# Patient Record
Sex: Female | Born: 1960 | Race: White | Hispanic: No | Marital: Married | Smoking: Never smoker
Health system: Southern US, Community
[De-identification: ages and names within clinical notes are randomized; demographics above are authoritative.]

## PROBLEM LIST (undated history)

## (undated) DIAGNOSIS — I10 Essential (primary) hypertension: Secondary | ICD-10-CM

## (undated) DIAGNOSIS — R55 Syncope and collapse: Secondary | ICD-10-CM

## (undated) DIAGNOSIS — G4733 Obstructive sleep apnea (adult) (pediatric): Secondary | ICD-10-CM

## (undated) DIAGNOSIS — K649 Unspecified hemorrhoids: Secondary | ICD-10-CM

## (undated) DIAGNOSIS — E785 Hyperlipidemia, unspecified: Secondary | ICD-10-CM

## (undated) DIAGNOSIS — R519 Headache, unspecified: Secondary | ICD-10-CM

## (undated) DIAGNOSIS — C439 Malignant melanoma of skin, unspecified: Secondary | ICD-10-CM

## (undated) DIAGNOSIS — K635 Polyp of colon: Secondary | ICD-10-CM

## (undated) DIAGNOSIS — K228 Other specified diseases of esophagus: Secondary | ICD-10-CM

## (undated) DIAGNOSIS — K2289 Other specified disease of esophagus: Secondary | ICD-10-CM

## (undated) DIAGNOSIS — G8929 Other chronic pain: Secondary | ICD-10-CM

## (undated) DIAGNOSIS — R51 Headache: Secondary | ICD-10-CM

## (undated) HISTORY — DX: Obstructive sleep apnea (adult) (pediatric): G47.33

## (undated) HISTORY — DX: Headache, unspecified: R51.9

## (undated) HISTORY — DX: Hyperlipidemia, unspecified: E78.5

## (undated) HISTORY — PX: VESICOVAGINAL FISTULA CLOSURE W/ TAH: SUR271

## (undated) HISTORY — DX: Polyp of colon: K63.5

## (undated) HISTORY — DX: Other specified diseases of esophagus: K22.8

## (undated) HISTORY — PX: BLADDER SUSPENSION: SHX72

## (undated) HISTORY — DX: Malignant melanoma of skin, unspecified: C43.9

## (undated) HISTORY — DX: Unspecified hemorrhoids: K64.9

## (undated) HISTORY — DX: Other specified disease of esophagus: K22.89

## (undated) HISTORY — PX: ABDOMINAL HYSTERECTOMY: SHX81

## (undated) HISTORY — PX: TUBAL LIGATION: SHX77

## (undated) HISTORY — DX: Other chronic pain: G89.29

## (undated) HISTORY — PX: KNEE SURGERY: SHX244

## (undated) HISTORY — DX: Syncope and collapse: R55

## (undated) HISTORY — PX: PACEMAKER INSERTION: SHX728

## (undated) HISTORY — DX: Headache: R51

## (undated) HISTORY — PX: COLONOSCOPY: SHX174

## (undated) HISTORY — PX: ESOPHAGEAL DILATION: SHX303

## (undated) HISTORY — DX: Essential (primary) hypertension: I10

## (undated) HISTORY — PX: LUMBAR DISC SURGERY: SHX700

---

## 2002-08-01 ENCOUNTER — Emergency Department (HOSPITAL_COMMUNITY): Admission: EM | Admit: 2002-08-01 | Discharge: 2002-08-01 | Payer: Self-pay | Admitting: Anesthesiology

## 2008-02-01 ENCOUNTER — Ambulatory Visit: Payer: Self-pay | Admitting: Internal Medicine

## 2008-02-08 ENCOUNTER — Ambulatory Visit: Payer: Self-pay | Admitting: Internal Medicine

## 2008-02-08 LAB — CONVERTED CEMR LAB
BUN: 11 mg/dL (ref 6–23)
Basophils Absolute: 0.1 10*3/uL (ref 0.0–0.1)
Calcium: 9.1 mg/dL (ref 8.4–10.5)
Creatinine, Ser: 0.7 mg/dL (ref 0.4–1.2)
Eosinophils Absolute: 0.3 10*3/uL (ref 0.0–0.7)
GFR calc Af Amer: 114 mL/min
GFR calc non Af Amer: 95 mL/min
HCT: 39 % (ref 36.0–46.0)
Lymphocytes Relative: 30.3 % (ref 12.0–46.0)
Neutrophils Relative %: 53.6 % (ref 43.0–77.0)
Platelets: 201 10*3/uL (ref 150–400)
Prothrombin Time: 12 s (ref 10.9–13.3)
RBC: 4.59 M/uL (ref 3.87–5.11)
RDW: 13 % (ref 11.5–14.6)
Sodium: 140 meq/L (ref 135–145)
aPTT: 27.8 s (ref 21.7–29.8)

## 2008-02-15 ENCOUNTER — Ambulatory Visit (HOSPITAL_COMMUNITY): Admission: RE | Admit: 2008-02-15 | Discharge: 2008-02-16 | Payer: Self-pay | Admitting: Internal Medicine

## 2008-02-15 ENCOUNTER — Ambulatory Visit: Payer: Self-pay | Admitting: Internal Medicine

## 2008-03-07 ENCOUNTER — Ambulatory Visit: Payer: Self-pay

## 2008-05-16 ENCOUNTER — Ambulatory Visit: Payer: Self-pay | Admitting: Internal Medicine

## 2008-05-17 ENCOUNTER — Encounter: Payer: Self-pay | Admitting: Internal Medicine

## 2008-05-22 ENCOUNTER — Emergency Department (HOSPITAL_COMMUNITY): Admission: EM | Admit: 2008-05-22 | Discharge: 2008-05-22 | Payer: Self-pay | Admitting: Emergency Medicine

## 2009-02-19 ENCOUNTER — Ambulatory Visit: Payer: Self-pay | Admitting: Internal Medicine

## 2009-02-19 DIAGNOSIS — I10 Essential (primary) hypertension: Secondary | ICD-10-CM | POA: Insufficient documentation

## 2009-02-19 DIAGNOSIS — G4730 Sleep apnea, unspecified: Secondary | ICD-10-CM | POA: Insufficient documentation

## 2009-05-13 ENCOUNTER — Encounter: Payer: Self-pay | Admitting: Internal Medicine

## 2009-06-07 ENCOUNTER — Encounter: Payer: Self-pay | Admitting: Internal Medicine

## 2009-06-24 ENCOUNTER — Encounter: Payer: Self-pay | Admitting: Internal Medicine

## 2009-10-31 ENCOUNTER — Encounter (INDEPENDENT_AMBULATORY_CARE_PROVIDER_SITE_OTHER): Payer: Self-pay | Admitting: *Deleted

## 2010-05-13 NOTE — Letter (Signed)
Summary: Device-Delinquent Phone Journalist, newspaper, Main Office  [address]   [place], [place] [address]   Phone: [phone]  Fax: [phone]     June 07, 2009 MRN: [id number]   STARR URIAS [address] [place], [place]  [address]   Dear Ms. Wager,  According to our records, you were scheduled for a device phone transmission on  May 20, 2009.     We did not receive any results from this check.  If you transmitted on your scheduled day, please call us to help troubleshoot your system.  If you forgot to send your transmission, please send one upon receipt of this letter.  Thank you,   Architectural technologist [facility]

## 2010-05-13 NOTE — Letter (Signed)
Summary: Device-Delinquent Phone Journalist, newspaper, Main Office  [address]   [place], [place] [address]   Phone: [phone]  Fax: [phone]     June 24, 2009 MRN: [id number]   ELLIEANNA FUNDERBURG [address] [place], [place]  [address]   Dear Ms. Ostrow,  According to our records, you were scheduled for a device phone transmission on May 20, 2009.     We did not receive any results from this check.  If you transmitted on your scheduled day, please call us to help troubleshoot your system.  If you forgot to send your transmission, please send one upon receipt of this letter.  Thank you,   Architectural technologist [facility]

## 2010-05-13 NOTE — Consult Note (Signed)
Summary: [facility]  [facility]   Imported By: Marylou Mccoy 09/11/2009 16:53:46  _____________________________________________________________________  External Attachment:    Type:   Image     Comment:   External Document

## 2010-05-13 NOTE — Letter (Signed)
Summary: Device-Delinquent Phone Journalist, newspaper, Main Office  [address]   [place], [place] [address]   Phone: [phone]  Fax: [phone]     October 31, 2009 MRN: [id number]   Marilyn Ortiz [address] [place], [place]  [address]   Dear [name],  According to our records, you were scheduled for a device phone transmission on                                 05-20-2009.  We did not receive any results from this check.  If you transmitted on your scheduled day, please call us to help troubleshoot your system.  If you forgot to send your transmission, please send one upon receipt of this letter.  Thank you,   Architectural technologist [facility]

## 2010-06-13 ENCOUNTER — Encounter (INDEPENDENT_AMBULATORY_CARE_PROVIDER_SITE_OTHER): Payer: Self-pay | Admitting: *Deleted

## 2010-06-19 NOTE — Letter (Signed)
Summary: Appointment - Reschedule  [employer], Main Office  [address]   [place], [place] [address]   Phone: [phone]  Fax: [phone]     June 13, 2010 MRN: [id number]   Marilyn Ortiz [address] [place], [place]  [address]   Dear Ms. Brendle,   Due to a change in our office schedule, your appointment on   07-08-2010                    at  11:40 a.m.              must be changed.  It is very important that we reach you to reschedule this appointment. We look forward to participating in your health care needs. Please contact us at the number listed above at your earliest convenience to reschedule this appointment.     Sincerely,     Lorne Skeens [facility] Scheduling Team

## 2010-06-24 ENCOUNTER — Encounter: Payer: Self-pay | Admitting: Internal Medicine

## 2010-07-08 ENCOUNTER — Encounter: Payer: Self-pay | Admitting: Internal Medicine

## 2010-07-22 ENCOUNTER — Encounter: Payer: Self-pay | Admitting: Internal Medicine

## 2010-07-22 ENCOUNTER — Ambulatory Visit (INDEPENDENT_AMBULATORY_CARE_PROVIDER_SITE_OTHER): Payer: BC Managed Care – PPO | Admitting: Internal Medicine

## 2010-07-22 DIAGNOSIS — G4730 Sleep apnea, unspecified: Secondary | ICD-10-CM

## 2010-07-22 DIAGNOSIS — R55 Syncope and collapse: Secondary | ICD-10-CM | POA: Insufficient documentation

## 2010-07-22 DIAGNOSIS — R5383 Other fatigue: Secondary | ICD-10-CM

## 2010-07-22 DIAGNOSIS — Z950 Presence of cardiac pacemaker: Secondary | ICD-10-CM | POA: Insufficient documentation

## 2010-07-22 DIAGNOSIS — R5381 Other malaise: Secondary | ICD-10-CM

## 2010-07-22 NOTE — Assessment & Plan Note (Signed)
Her diastolic blood pressure is elevated. Because of her sleep apnea, I'm hoping that it is secondary. Rather than change her medications at this time we will pursue reevaluation of her sleep apnea

## 2010-07-22 NOTE — Assessment & Plan Note (Signed)
The patient's device was interrogated.  The information was reviewed. No changes were made in the programming.    

## 2010-07-22 NOTE — Assessment & Plan Note (Signed)
Undoubtedly there were many causes for fatigue. In her, I wonder whether her beta blocker or her sleep apnea is continuing. Given the intention of trying to pursue evaluation of the latter, we will plan that first. In the event that her fatigue persists, I would recommend that we stop her beta blocker and try her on alternative Anti hypertensive therapy

## 2010-07-22 NOTE — Patient Instructions (Signed)
Your physician recommends that you schedule a follow-up appointment in: YEAR WITH DR Graciela Husbands AND [facility] AS SCHEDULED. Your physician recommends that you continue on your current medications as directed. Please refer to the Current Medication list given to you today. You have been referred to  DR SOOD OR DR ALVA  DX FATIGUE, HX OFSLEEP APNEA

## 2010-07-22 NOTE — Progress Notes (Signed)
  HPI  Marilyn Ortiz is a 50 y.o. 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  She also has a history of fatigue and sleep apnea. Unfortunately her sleep apnea mask is not wearing well; she has not been seen by the application team since it was initiated. She continues to have problems with hypertension. There has been some peripheral edema. She takes a diuretic.  She denies chest pain. There is some exercise intolerance manifested as dyspnea.  Past Medical History  Diagnosis Date  . Deglutition syncope   . HTN (hypertension)   . Chronic headaches   . OSA (obstructive sleep apnea)   . Dilation of esophagus     Past Surgical History  Procedure Date  . Pacemaker insertion   . Tubal ligation   . Knee surgery     Current Outpatient Prescriptions  Medication Sig Dispense Refill  . atorvastatin (LIPITOR) 20 MG tablet Take 20 mg by mouth daily.        . Doxylamine Succinate, Sleep, (UNISOM) 25 MG tablet Take 50 mg by mouth at bedtime as needed.        . hydrochlorothiazide (,MICROZIDE/HYDRODIURIL,) 12.5 MG capsule Take 12.5 mg by mouth daily.        [name] ketoprofen (ORUDIS) 75 MG capsule Take 75 mg by mouth 4 (four) times daily as needed.        . loratadine (CLARITIN) 10 MG tablet Take 10 mg by mouth daily.        . nebivolol (BYSTOLIC) 5 MG tablet Take 10 mg by mouth daily.       . potassium chloride (K-DUR) 10 MEQ tablet Take 10 mEq by mouth daily.        [name] DISCONTD: amLODipine (NORVASC) 5 MG tablet Take 5 mg by mouth daily.       [name] DISCONTD: esomeprazole (NEXIUM) 40 MG capsule Take 40 mg by mouth 2 (two) times daily.          Allergies  Allergen Reactions  . Codeine   . Morphine And Related   . Penicillins     Review of Systems negative except from HPI and PMH  Physical Exam Well developed and well nourished in no acute distress HENT normal E scleral and icterus clear Neck Supple JVP flat; carotids brisk and full Clear to  ausculation Regular rate and rhythm, no murmurs gallops or rub Soft with active bowel sounds No clubbing cyanosis and edema Alert and oriented, grossly normal motor and sensory function Skin Warm and Dry    Assessment and  Plan

## 2010-07-22 NOTE — Assessment & Plan Note (Signed)
As noted below, we will plan to have her see a sleep specialist to see if some adjustments of her therapy can be accomplished.

## 2010-07-22 NOTE — Assessment & Plan Note (Signed)
No recurrent syncope 

## 2010-07-29 LAB — BASIC METABOLIC PANEL
Chloride: 108 mEq/L (ref 96–112)
GFR calc Af Amer: >60 mL/min (ref >60)
GFR calc non Af Amer: >60 mL/min (ref >60)
Sodium: 138 mEq/L (ref 135–145)

## 2010-08-15 ENCOUNTER — Institutional Professional Consult (permissible substitution): Payer: BC Managed Care – PPO | Admitting: Pulmonary Disease

## 2010-08-26 ENCOUNTER — Encounter: Payer: Self-pay | Admitting: *Deleted

## 2010-08-26 NOTE — Discharge Summary (Signed)
NAMEANERI, Marilyn Ortiz                 ACCOUNT NO.:  [id number]   MEDICAL RECORD NO.:  [id number]          PATIENT TYPE:  INP   LOCATION:  3731                         FACILITY:  [facility]   PHYSICIAN:  Duke Salvia, MD, FACCDATE OF BIRTH:  12-15-1960   DATE OF ADMISSION:  02/15/2008  DATE OF DISCHARGE:  02/16/2008                               DISCHARGE SUMMARY   This patient has allergies to PENICILLIN and intolerance of CODEINE.   FINAL DIAGNOSES:  1. Discharging day 1 status post implant of a St. Jude Accent RF dual-      chamber pacemaker.  2. History of presyncope/syncope concurrence with eating or drinking      (deglutition syncope).  3. Migraine headache postprocedure delaying discharge postprocedure      day #1, treated with 1 g IV Depakote over 10 minutes with      resolution of symptoms, then the patient ready for discharge.   PROCEDURE:  February 15, 2008, implant of the [place]. Jude Accent dual-  chamber pacemaker for deglutition syncope by Dr. Graciela Husbands.   BRIEF HISTORY:  Marilyn Ortiz is a 50 year old female.  She has been  referred in consultation with diagnosis of deglutition syncope.   She has a 10-year history of recurrent episodes.  She describes them as  a sudden storminess in her head which is followed by presyncope or  syncope.  They have all occurred with eating and/or drinking.  They have  become increasingly frequent and now occur almost daily.   PAST MEDICAL HISTORY:  Notable for hypertension.  She has migraine  headaches.  She has been diagnosed with obstructive sleep apnea, but  does not use continuous positive airway pressure.   The patient also has a history of ulcers and esophageal dilatation.  She  has had bladder tack procedure, skin cancer removal, and seasonal  allergies.   She has clear evidence of deglutition syncope with significant sinus  node dysfunction as the mechanism underlying presyncope and also loss of  consciousness.  Pacemaker is indicated.   The risks and benefits of this  procedure have been described to the patient and she is willing to  proceed with this procedure.  St. Jude pacemaker will be utilized as it  has a very Programmer, systems life.   HOSPITAL COURSE:  The patient presents electively on February 15, 2008.  She underwent successful implantation of this dual-chamber pacemaker by  Dr. Sherryl Manges.  In the postprocedure period, she developed migraine  headache.  This was treated with 1 g of IV Depakote with successful  resolution of her symptoms.  She described this particular headache as  of the usual sort, neither severe nor light, but about moderate in its  symptoms.  The patient is willing to go home on February 16, 2008, on the  following medications.  1. Topamax 25 mg daily.  2. Claritin 10 mg daily.  3. Diovan 160 mg daily.   She is asked to keep her incision dry for the next 7 days and to sponge  bathe until Wednesday February 22, 2008.  Mobility of the  left arm has  been discussed as has incision care.  She has followup at [facility] on March 08, 2008, [place], [place].  [facility] Wednesday, March 07, 2008, at 9 o'clock.  She sees Dr. Graciela Husbands  on May 16, 2008, at 11:15.   Labs pertinent to this admission were drawn on February 08, 2008.  White  cells 6, hemoglobin 13.6, hematocrit 39, platelets of 201, protime 12,  INR is 1, sodium 140, potassium is 4, chloride 105, carbonate 30,  glucose 74, BUN is 11, and creatinine 0.7.      Marilyn Ortiz, [place]      Duke Salvia, MD, [facility]  Electronically Signed    GM/MEDQ  D:  02/16/2008  T:  02/17/2008  Job:  [id number]   cc:   Duke Salvia, MD, [facility]  [facility]  [name], [place]

## 2010-08-26 NOTE — Assessment & Plan Note (Signed)
 HEALTHCARE                         ELECTROPHYSIOLOGY OFFICE NOTE   JACQULIN, BRANDENBURGER                        MRN:          [id number]  DATE:05/16/2008                            DOB:          1960/12/25    Ms. Nam comes in following pacemaker implantation for deglutition  syncope.  She has had no recurrent syncope where she has had a little  bit dizziness, which she correlates with her high blood pressure.   Her blood pressure remains an issue with diastolics in the low 100.   MEDICATIONS:  1. Avapro 150.  2. Tekturna HCT 150/12.5.  3. Nexium.  4. Topamax.  5. Claritin.   PHYSICAL EXAMINATION:  VITAL SIGNS:  Her blood pressure today was again  elevated at 158/100 with a pulse of 103 and her weight was 203, which is  up 10 pounds.  LUNGS:  Clear.  HEART:  Sounds were regular and rapid.  EXTREMITIES:  Trace edema.   Interrogation her St. Jude pulse generator demonstrates P-wave of 2.4,  impedance of 450, and threshold of 0.75 at 0.5.  The R-wave was 4.8 with  impedance of 530 and threshold of 1.25 at 0.5.  She is atrially paced  3.5% of the time.  The device was reprogrammed.   IMPRESSION:  1. Deglutition syncope.  2. Status post pacer for the above.  3. Sinus tachycardia.  4. Hypertension.   [name] is going to check on her thyroid status.  We are going to  go ahead and add low-dose beta-blockers.  I have given prescriptions for  atenolol 50 mg and Inderal LA 60 to take.  She will be following up with  [name] within the month and she is to let us know how she  tolerates the above drugs.   We will see her again in 9 months' time.     Duke Salvia, MD, [facility]  Electronically Signed    SCK/MedQ  DD: 05/16/2008  DT: 05/17/2008  Job #: [id number]   cc:   [name], PA-C

## 2010-08-26 NOTE — Op Note (Signed)
NAME:  Marilyn Ortiz, Marilyn Ortiz                 ACCOUNT NO.:  [id number]   MEDICAL RECORD NO.:  [id number]          PATIENT TYPE:  INP   LOCATION:  3731                         FACILITY:  [facility]   PHYSICIAN:  Duke Salvia, MD, FACCDATE OF BIRTH:  Feb 23, 1961   DATE OF PROCEDURE:  02/15/2008  DATE OF DISCHARGE:                               OPERATIVE REPORT   PREOPERATIVE DIAGNOSIS:  Deglutition syncope.   POSTOPERATIVE DIAGNOSIS:  Deglutition syncope.   PROCEDURE:  Dual-chamber pacemaker implantation.   Following obtaining informed consent, the patient was brought to the  Electrophysiology Laboratory and placed on the fluoroscopic table in  supine position.  After routine prep and drape of the left upper chest,  lidocaine was infiltrated in the pectoral subclavicular region.  An  incision was made and carried down to the layer of the prepectoral  fascia using electrocautery and sharp dissection.  Unfortunately, I  violated the prepectoral fascia (see below).  At this point, attention  was turned to gain access to the extrathoracic left subclavian vein,  which was accomplished without difficulty without the aspiration of air  or puncture of the artery.  Sequentially, a 7-French sheath were placed,  which was passed a St Jude 16 x 88TC x 52-cm active fixation ventricular  leads, serial # [id number] and a 1688TC active fixation atrial lead 46 cm  at length, serial #[id number].  Under fluoroscopic guidance, these were  manipulated to the right ventricular septum and the right atrial  appendage respectively where the bipolar R-wave was 17.2 with a pace  impedance of 709 ohms, and the threshold of 0.7 volts at 0.5  milliseconds.  Current threshold of 0.8 MA.  There is no diaphragmatic  pacing at 10 volts with current of injury was brisk.  The ventricular  lead was marked with a tie.   The bipolar P-wave was 6.2 with a pace impedance of 972 and a threshold  of 0.8 volts at 0.5 milliseconds.  Current  threshold is 0.9 MA.  There  was no diaphragmatic pacing at 10 volts and the current of injury was  brisk.  The leads were then secured to the prepectoral fascia and  attached to a St. Jude Accent RF pacemaker serial [id number], serial  [id number].  The pocket was copiously irrigated with antibiotic-containing  saline solution.  Because of the violation of the prepectoral fascia, I  ended up putting a piece of Surgicel across the exposed muscle.  The  leads and the pulse generator were placed in the pocket and secured to  the prepectoral fascia.  The wound was closed in three layers in the  normal fashion.  The wound was washed, dried, and a benzoin Steri-Strip  dressing was applied.  Needle counts, sponge counts, and instrument  counts were correct at the end of the procedure according to the staff.  The patient tolerated the procedure without apparent complication.      Duke Salvia, MD, [facility]  Electronically Signed     SCK/MEDQ  D:  02/15/2008  T:  02/16/2008  Job:  [id number]

## 2010-08-26 NOTE — Letter (Signed)
February 01, 2008    Marilyn Ortiz, [facility]  [facility]  [address]  [place], [place] [address]   RE:  Marilyn, Ortiz  MRN:  [id number]  /  DOB:  1960-05-22   Dear Marilyn Ortiz,   It was a pleasure seeing Marilyn Ortiz this morning.  She came in with her  husband, your having made the diagnosis of deglutition syncope.   She has a 10-year history of recurrent episodes which she describes as  woo-woo in her head followed by presyncope and or syncope.  These have  all occurred with eating and or drinking.  They have become increasingly  frequent over time, now occurring almost daily.   Her past medical history is notable for hypertension.  She also has  headaches that occur in the mornings and has a diagnosis of obstructive  sleep apnea.  CPAP is currently not being worn.   In addition to her past medical history, there is notable for ulcers and  esophageal dilatation but there is no significant improvement in her  symptoms with esophageal dilatation.  She also has a history of bladder  problems with a bladder tack procedure, skin cancers, and allergy.   Her medications currently include Topamax, Claritin, and Diovan started  for diastolic hypertension.   She is allergic to PENICILLN.  Intolerance of CODEINE.   SOCIAL HISTORY:  She is married.  She has 3 children.  She works Public affairs consultant.  She does not use cigarettes or recreational drugs.  She  drinks alcohol occasionally.  She does not exercise.   PHYSICAL EXAMINATION:  GENERAL:  She is a middle-aged Caucasian female  appearing in her stated age of 74.  VITAL SIGNS:  Her weight was 194, her blood pressure is 142/90, and her  pulse was 99.  HEENT:  No icterus or xanthoma.  NECK:  Veins were flat.  The carotids were brisk and full bilaterally  without bruits.  BACK:  Without kyphosis or scoliosis.  LUNGS:  Clear.  CARDIAC:  Heart sounds were regular without murmurs or gallops.  ABDOMEN:  Soft with  active bowel sounds without midline pulsation or  hepatomegaly.  Femoral pulses were 2+.  Distal pulses were intact.  There is no clubbing, cyanosis, or edema.  NEUROLOGIC:  Grossly normal.  SKIN:  Warm and dry.   Telemetry tracings are very illuminating and with all your detective  work, it became quite clear that she has sinus arrest associated with  her eating and drinking.  There is no evidence of unconducted P waves.   IMPRESSION:  1. 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  2. History of gastroesophageal reflux disease, esophageal stricture      with dilatation, but no correlation with the above symptoms.  3. Hypertension.  4. Obstructive sleep apnea.   Marilyn Ortiz, Marilyn Ortiz has deglutition syncope by history and clear evidence of  significant sinus node dysfunction as the mechanism underlying her  presyncope and loss of consciousness.  Pacing is indicated.  Given the  long-standing nature of this and the lack of improvement with prior  esophageal procedures, I do not think further clarification of her  gastrointestinal pathology will have an impact on the above.   I have discussed with her and her husband the potential benefits as well  as the potential risks including, but not limited to death, perforation,  infection, and vascular thrombosis.  She understands these risks and is  willing to proceed.   We  will undertake St. Jude pacemaker implantation choosing that device  because of it's very long battery life.   Thank you very much for the consultation.    Sincerely,      Duke Salvia, MD, [facility]  Electronically Signed    SCK/MedQ  DD: 02/01/2008  DT: 02/01/2008  Job #: [id number]

## 2010-09-01 ENCOUNTER — Encounter: Payer: Self-pay | Admitting: Pulmonary Disease

## 2010-09-01 ENCOUNTER — Ambulatory Visit (INDEPENDENT_AMBULATORY_CARE_PROVIDER_SITE_OTHER): Payer: BC Managed Care – PPO | Admitting: Pulmonary Disease

## 2010-09-01 ENCOUNTER — Telehealth: Payer: Self-pay | Admitting: Pulmonary Disease

## 2010-09-01 VITALS — BP 132/94 | HR 103 | Temp 98.0°F | Ht 63.0 in | Wt 220.6 lb

## 2010-09-01 DIAGNOSIS — G4730 Sleep apnea, unspecified: Secondary | ICD-10-CM

## 2010-09-01 NOTE — Progress Notes (Signed)
  Subjective:    Patient ID: Marilyn Ortiz, female    DOB: 09-20-1960, 50 y.o.   MRN: [id number]  HPI 49/F never smoker for evaluation of obstructive sleep apnea & excessive fatigue. Deglutition syncope x 20 yrs, s/p PPM x 3 yrs Htn On bystolic  X 4 yrs Sleep study at [facility], for excessive daytime somnolence & snoring >> stopped breathing a lot, on nasal CPAP since - used x 2 years then stopped. Has not received supplies since. She has a REMstar machine set at 13 cm Bedtime 11p, latency x 1 hr - uses UNISOM as sleep aid, 3- awakenings - esp when her husband comes in from work at 3 am, oob at 0800 tired, occasional headace, dry mouth, keeps her grandkids daytime - cannot nap ESS 9/24 She has gained 30 lbs in the last 5 years Baseline PSG 9/06 showed RDI 18/h, nadir desatn to 80% corrected by CPAP 13 cm with C flex of 2 cm.    Review of Systems  Constitutional: Negative for fever and unexpected weight change.  HENT: Positive for congestion and trouble swallowing. Negative for ear pain, nosebleeds, sore throat, rhinorrhea, sneezing, dental problem, postnasal drip and sinus pressure.   Eyes: Negative for redness and itching.  Respiratory: Positive for cough and shortness of breath. Negative for chest tightness and wheezing.   Cardiovascular: Positive for leg swelling. Negative for palpitations.  Gastrointestinal: Negative for nausea and vomiting.  Genitourinary: Negative for dysuria.  Musculoskeletal: Positive for joint swelling and arthralgias.  Skin: Negative for rash.  Neurological: Negative for headaches.  Hematological: Bruises/bleeds easily.  Psychiatric/Behavioral: Negative for dysphoric mood. The patient is not nervous/anxious.        Objective:   Physical Exam    Gen. Pleasant, well-nourished, in no distress, normal affect ENT - no lesions, no post nasal drip, class 2 airway Neck: No JVD, no thyromegaly, no carotid bruits Lungs: no use of accessory muscles, no dullness to  percussion, clear without rales or rhonchi  Cardiovascular: Rhythm regular, heart sounds  normal, no murmurs or gallops, no peripheral edema Abdomen: soft and non-tender, no hepatosplenomegaly, BS normal. Musculoskeletal: No deformities, no cyanosis or clubbing Neuro:  alert, non focal     Assessment & Plan:

## 2010-09-01 NOTE — Assessment & Plan Note (Addendum)
PSG done at [facility] -reviewed.  But severe by history We will get her a new DMe company , CPAP supplies & get her restarted on CPAP - check donwload in 1 month to ensure adequate compliance & pressure. Since she has gained wt, possible that's he may ned higher pressure.  If hypertension & fatigue persist then, can consider changing bystolic Weight loss encouraged, compliance with goal of at least 4-6 hrs every night is the expectation. Advised against medications with sedative side effects Cautioned against driving when sleepy - understanding that sleepiness will vary on a day to day basis

## 2010-09-01 NOTE — Patient Instructions (Signed)
We will get you a new DMe company for CPAP supplies Please get back on your CPAP machine & turn in the card in 1 month

## 2010-09-02 NOTE — Telephone Encounter (Signed)
Spoke to [employer] and will fax a copy of ins card answered all other questions such as her npsg was done in [place] and she just need a dme in [place] to supply her cpap supplies

## 2010-10-13 ENCOUNTER — Encounter: Payer: Self-pay | Admitting: Pulmonary Disease

## 2010-10-13 ENCOUNTER — Ambulatory Visit (INDEPENDENT_AMBULATORY_CARE_PROVIDER_SITE_OTHER): Payer: BC Managed Care – PPO | Admitting: Pulmonary Disease

## 2010-10-13 VITALS — BP 112/88 | HR 72 | Temp 98.5°F | Ht 63.0 in | Wt 221.8 lb

## 2010-10-13 DIAGNOSIS — G4730 Sleep apnea, unspecified: Secondary | ICD-10-CM

## 2010-10-13 NOTE — Progress Notes (Signed)
  Subjective:    Patient ID: Marilyn Ortiz, female    DOB: May 17, 1960, 50 y.o.   MRN: [id number]  HPI 49/F never smoker for FU of obstructive sleep apnea & excessive fatigue.  Deglutition syncope x 20 yrs, s/p PPM x 3 yrs  Htn On bystolic X 4 yrs  Baseline PSG 9/06 at [facility] showed RDI 18/h, nadir desatn to 80% corrected by CPAP 13 cm with C flex of 2 cm, on nasal CPAP since - used x 2 years then stopped. Has not received supplies since. She has a REMstar machine set at 13 cm  Bedtime 11p, latency x 1 hr - uses UNISOM as sleep aid, 3- awakenings - esp when her husband comes in from work at 3 am, oob at 0800 tired, occasional headace, dry mouth, keeps her grandkids daytime - cannot nap  ESS 9/24  She has gained 30 lbs in the last 5 years    10/13/2010 Download 6/29-7/1 shows AHI 4/h - good ocntrol of events,  avg usage 3.45 h  Discussed compliance & alternative options including oral appliance - she uses mouth guard for bruxism Nasal mask leaves mark over her face.    Review of Systems Pt denies any significant  nasal congestion or excess secretions, fever, chills, sweats, unintended wt loss, pleuritic or exertional cp, orthopnea pnd or leg swelling.  Pt also denies any obvious fluctuation in symptoms with weather or environmental change or other alleviating or aggravating factors.    Pt denies any increase in rescue therapy over baseline, denies waking up needing it or having early am exacerbations or coughing/wheezing/ or dyspnea      Objective:   Physical Exam    Gen. Pleasant, well-nourished, in no distress ENT - no lesions, no post nasal drip Neck: No JVD, no thyromegaly, no carotid bruits Lungs: no use of accessory muscles, no dullness to percussion, clear without rales or rhonchi  Cardiovascular: Rhythm regular, heart sounds  normal, no murmurs or gallops, no peripheral edema Musculoskeletal: No deformities, no cyanosis or clubbing      Assessment & Plan:

## 2010-10-13 NOTE — Patient Instructions (Signed)
Turn in download card in 1 month Trial of NASAL PILLOWS for HER in 3 months You can alternate mask with pilllows Weight reduction

## 2010-10-14 NOTE — Assessment & Plan Note (Signed)
We had a frank discusion about her compliance , CPPA benefits & alternative options including oral appliance - she uses mouth guard for bruxism. She will try nasal pillows  Weight loss encouraged, compliance with goal of at least 4-6 hrs every night is the expectation. Advised against medications with sedative side effects Cautioned against driving when sleepy - understanding that sleepiness will vary on a day to day basis

## 2010-10-16 ENCOUNTER — Encounter: Payer: BC Managed Care – PPO | Admitting: *Deleted

## 2010-10-20 ENCOUNTER — Encounter: Payer: Self-pay | Admitting: *Deleted

## 2010-10-20 ENCOUNTER — Encounter: Payer: Self-pay | Admitting: Pulmonary Disease

## 2010-12-31 ENCOUNTER — Telehealth: Payer: Self-pay | Admitting: Internal Medicine

## 2010-12-31 NOTE — Telephone Encounter (Signed)
Pt calling wanting to know if Dr. Graciela Husbands can prescribe pt BP medicine. Pt PCP was prescribing RX, but no longer is. Please return pt call discuss further.    Pt also wants to know if we recommended a PCP for pt.

## 2010-12-31 NOTE — Telephone Encounter (Signed)
Spoke with pt. She reports she has been unable to get refill of bystolic from primary MD and is asking if Dr. Graciela Husbands can refill for her.  Medicine was prescribed by primary MD. Pt does not know what does she is taking. States it is one pill but doesn't know if it is 5 or 10mg .  I told pt we could not refill as she did not know dose she was taking .  She then stated Dr. Graciela Husbands had told her he wanted to get her off bystolic and she was wondering if bystolic could be changed to another medication. She is asking for a recommendation for primary MD. I offered her an appt with our PA to discuss blood pressure meds but she states she would like to wait for Dr. Odessa Fleming recommendations.

## 2010-12-31 NOTE — Telephone Encounter (Signed)
dont have specific recommendaton  The hope was that with treatment of her sleep apnea that her HTN would improve   She should followup with her PCP about specific BP recs Thanks steve

## 2010-12-31 NOTE — Telephone Encounter (Signed)
Spoke with pt and gave her information below from Dr. Graciela Husbands

## 2011-02-14 ENCOUNTER — Encounter: Payer: Self-pay | Admitting: Internal Medicine

## 2011-04-20 ENCOUNTER — Encounter: Payer: Self-pay | Admitting: *Deleted

## 2011-07-24 ENCOUNTER — Telehealth: Payer: Self-pay | Admitting: Internal Medicine

## 2011-07-24 ENCOUNTER — Encounter: Payer: Self-pay | Admitting: Internal Medicine

## 2011-07-24 ENCOUNTER — Ambulatory Visit (INDEPENDENT_AMBULATORY_CARE_PROVIDER_SITE_OTHER): Payer: BC Managed Care – PPO | Admitting: Internal Medicine

## 2011-07-24 VITALS — BP 157/108 | HR 88 | Ht 63.0 in | Wt 216.8 lb

## 2011-07-24 DIAGNOSIS — I495 Sick sinus syndrome: Secondary | ICD-10-CM | POA: Insufficient documentation

## 2011-07-24 DIAGNOSIS — Z950 Presence of cardiac pacemaker: Secondary | ICD-10-CM

## 2011-07-24 LAB — PACEMAKER DEVICE OBSERVATION
AL AMPLITUDE: 3.3 mv
AL IMPEDENCE PM: 400 Ohm
ATRIAL PACING PM: 4.1
BATTERY VOLTAGE: 2.98 V
DEVICE MODEL PM: 2150303
RV LEAD THRESHOLD: 1.25 V
VENTRICULAR PACING PM: 1

## 2011-07-24 MED ORDER — LISINOPRIL 10 MG PO TABS: 10.0000 mg | ORAL_TABLET | Freq: Every day | ORAL | Status: DC

## 2011-07-24 NOTE — Assessment & Plan Note (Signed)
No recurrence. 

## 2011-07-24 NOTE — Assessment & Plan Note (Signed)
Not treated  Will add lisinopril  Have reviewed side effects and need for BMET in 2-3 weeks

## 2011-07-24 NOTE — Patient Instructions (Signed)
Your physician has recommended you make the following change in your medication:  1) Start lisinopril 10 mg one tablet by mouth once daily.  Remote monitoring is used to monitor your Pacemaker of ICD from home. This monitoring reduces the number of office visits required to check your device to one time per year. It allows [place] to keep an eye on the functioning of your device to ensure it is working properly. You are scheduled for a device check from home on 10/22/11. You may send your transmission at any time that day. If you have a wireless device, the transmission will be sent automatically. After your physician reviews your transmission, you will receive a postcard with your next transmission date.   Your physician wants you to follow-up in: 1 year with Dr. Graciela Husbands. You will receive a reminder letter in the mail two months in advance. If you don't receive a letter, please call our office to schedule the follow-up appointment.

## 2011-07-24 NOTE — Telephone Encounter (Signed)
New Problem:    Patient called in because [facility] told her that the earliest they would be able to see her would be in August, and she would like a referral to a new PCP office. Please call back.

## 2011-07-24 NOTE — Assessment & Plan Note (Signed)
The patient's device was interrogated.  The information was reviewed. No changes were made in the programming.    

## 2011-07-24 NOTE — Progress Notes (Signed)
  HPI  Marilyn Ortiz is a 51 y.o. 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  She also has a history of fatigue and sleep apnea.   She is using her mask  She has run out of meds and PCP in [place] not taking BC/BS     Past Medical History  Diagnosis Date  . Deglutition syncope   . HTN (hypertension)   . Chronic headaches   . OSA (obstructive sleep apnea)   . Dilation of esophagus   . Hyperlipidemia   . Skin cancer (melanoma) ? 2002    Back    Past Surgical History  Procedure Date  . Pacemaker insertion     St. Jude Accent 2210  . Tubal ligation   . Knee surgery     right  . Esophageal dilation   . Cesarean section     x 2  . Lumbar disc surgery     L4  . Vesicovaginal fistula closure w/ tah   . Bladder suspension     Current Outpatient Prescriptions  Medication Sig Dispense Refill  . Doxylamine Succinate, Sleep, (UNISOM) 25 MG tablet Take 50 mg by mouth at bedtime as needed.          Allergies  Allergen Reactions  . Codeine   . Morphine And Related   . Penicillins     Review of Systems negative except from HPI and PMH  Physical Exam BP 157/108  Pulse 88  Ht 5\' 3"  (1.6 m)  Wt 216 lb 12.8 oz (98.34 kg)  BMI 38.40 kg/m2 Well developed and well nourished in no acute distress HENT normal E scleral and icterus clear Neck Supple JVP flat; carotids brisk and full Clear to ausculation   Regular rate and rhythm, no murmurs gallops or rub Soft with active bowel sounds No clubbing cyanosis none Edema Alert and oriented, grossly normal motor and sensory function Skin Warm and Dry   Assessment and  Plan

## 2011-07-24 NOTE — Telephone Encounter (Signed)
Will forward to Dr. Klein. 

## 2011-07-28 NOTE — Telephone Encounter (Signed)
She can be referred to [place] [facility]

## 2011-07-30 NOTE — Telephone Encounter (Signed)
The patient is aware of Dr. Odessa Fleming recommendations. She will call the [facility] location.

## 2011-10-22 ENCOUNTER — Encounter: Payer: BC Managed Care – PPO | Admitting: *Deleted

## 2011-10-27 ENCOUNTER — Encounter: Payer: Self-pay | Admitting: *Deleted

## 2012-03-28 ENCOUNTER — Telehealth: Payer: Self-pay | Admitting: Internal Medicine

## 2012-03-28 NOTE — Telephone Encounter (Signed)
Follow up.

## 2012-03-28 NOTE — Telephone Encounter (Signed)
Pt states that when she goes shopping or rides in the car her feet and ankles swell.  She is requesting a  Diuretic.  I told pt that she should try to reduce her salt intake and wear support stockings while in the car and up on her feet for extended periods of time.  She still wants a "water pill".  Denies chest pain or sob.  Will forward to Dr. Graciela Husbands.

## 2012-03-28 NOTE — Telephone Encounter (Signed)
New problem:    Would like to be prescribe fluid pill due to c/o increase swelling in foot, ankle, legs .

## 2012-04-04 NOTE — Telephone Encounter (Signed)
i would suggest she followup with her PCP who could adjust her BP meds to include a diuretic thanks

## 2012-04-15 ENCOUNTER — Encounter: Payer: Self-pay | Admitting: *Deleted

## 2012-04-15 NOTE — Telephone Encounter (Signed)
Pt informed

## 2012-05-02 ENCOUNTER — Ambulatory Visit (INDEPENDENT_AMBULATORY_CARE_PROVIDER_SITE_OTHER): Payer: BC Managed Care – PPO | Admitting: *Deleted

## 2012-05-02 DIAGNOSIS — I495 Sick sinus syndrome: Secondary | ICD-10-CM

## 2012-05-02 DIAGNOSIS — Z950 Presence of cardiac pacemaker: Secondary | ICD-10-CM

## 2012-05-02 LAB — REMOTE PACEMAKER DEVICE
AL IMPEDENCE PM: 390 Ohm
BATTERY VOLTAGE: 2.98 V
VENTRICULAR PACING PM: 1

## 2012-05-10 ENCOUNTER — Encounter: Payer: Self-pay | Admitting: *Deleted

## 2012-05-19 ENCOUNTER — Encounter: Payer: Self-pay | Admitting: Internal Medicine

## 2012-08-10 ENCOUNTER — Encounter: Payer: BC Managed Care – PPO | Admitting: Internal Medicine

## 2012-08-11 ENCOUNTER — Encounter: Payer: Self-pay | Admitting: Internal Medicine

## 2012-08-11 ENCOUNTER — Ambulatory Visit (INDEPENDENT_AMBULATORY_CARE_PROVIDER_SITE_OTHER): Payer: BC Managed Care – PPO | Admitting: Internal Medicine

## 2012-08-11 VITALS — BP 167/109 | HR 98 | Ht 64.0 in | Wt 218.0 lb

## 2012-08-11 DIAGNOSIS — I495 Sick sinus syndrome: Secondary | ICD-10-CM

## 2012-08-11 DIAGNOSIS — Z950 Presence of cardiac pacemaker: Secondary | ICD-10-CM

## 2012-08-11 DIAGNOSIS — R000 Tachycardia, unspecified: Secondary | ICD-10-CM | POA: Insufficient documentation

## 2012-08-11 LAB — PACEMAKER DEVICE OBSERVATION
AL IMPEDENCE PM: 412.5 Ohm
AL THRESHOLD: 0.75 V
ATRIAL PACING PM: 6.8
BAMS-0001: 180 {beats}/min
BAMS-0003: 70 {beats}/min
BATTERY VOLTAGE: 2.9629 V
RV LEAD AMPLITUDE: 7.2 mv
RV LEAD THRESHOLD: 1.25 V
VENTRICULAR PACING PM: 1

## 2012-08-11 MED ORDER — LISINOPRIL-HYDROCHLOROTHIAZIDE 20-25 MG PO TABS: 1.0000 | ORAL_TABLET | Freq: Every day | ORAL | Status: DC

## 2012-08-11 NOTE — Assessment & Plan Note (Signed)
100% atrial paced with agood heart rate excursion

## 2012-08-11 NOTE — Assessment & Plan Note (Signed)
No recurrent syncope 

## 2012-08-11 NOTE — Patient Instructions (Addendum)
Remote monitoring is used to monitor your Pacemaker of ICD from home. This monitoring reduces the number of office visits required to check your device to one time per year. It allows [place] to keep an eye on the functioning of your device to ensure it is working properly. You are scheduled for a device check from home on 11/14/12. You may send your transmission at any time that day. If you have a wireless device, the transmission will be sent automatically. After your physician reviews your transmission, you will receive a postcard with your next transmission date.  Your physician wants you to follow-up in: 1 year with Harden Mo, PA You will receive a reminder letter in the mail two months in advance. If you don't receive a letter, please call our office to schedule the follow-up appointment.  Your physician has recommended you make the following change in your medication: START Lisinopril HCT 20/25 daily  Your physician recommends that you return for lab work in: 3 weeks - BMP

## 2012-08-11 NOTE — Progress Notes (Signed)
Patient Care Team: Provider Not In System as PCP - General Duke Salvia, MD as Referring Physician (Cardiology)   HPI  Marilyn Ortiz is a 52 y.o. 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  She also has a history of fatigue and sleep apnea.  She is using her mask   She has had no recurrent syncope. She has hypertension. At our last visit we add antihypertensives. She went to [facility]. They were not willing to refill her medication because she was not hypertensive at that time. This is rather frustrating for her. She called here looking for assistance and without knowing the above we encouraged her to go back to her PCP     Past Medical History  Diagnosis Date  . Deglutition syncope   . HTN (hypertension)   . Chronic headaches   . OSA (obstructive sleep apnea)   . Dilation of esophagus   . Hyperlipidemia   . Skin cancer (melanoma) ? 2002    Back    Past Surgical History  Procedure Laterality Date  . Pacemaker insertion      St. Jude Accent 2210  . Tubal ligation    . Knee surgery      right  . Esophageal dilation    . Cesarean section      x 2  . Lumbar disc surgery      L4  . Vesicovaginal fistula closure w/ tah    . Bladder suspension      Current Outpatient Prescriptions  Medication Sig Dispense Refill  . Doxylamine Succinate, Sleep, (UNISOM) 25 MG tablet Take 50 mg by mouth at bedtime as needed.        [name] tiZANidine (ZANAFLEX) 4 MG tablet Take 1 to 2 tabs as needed at bedtime      . topiramate (TOPAMAX) 50 MG tablet Take 50 mg twice a day       No current facility-administered medications for this visit.    Allergies  Allergen Reactions  . Codeine   . Morphine And Related   . Penicillins     Review of Systems negative except from HPI and PMH  Physical Exam BP 167/109  Pulse 98  Ht 5\' 4"  (1.626 m)  Wt 218 lb (98.884 kg)  BMI 37.4 kg/m2 Well developed and nourished in no acute distress HENT  normal Neck supple with JVP-flat Clear Regular rate and rhythm, no murmurs or gallops Abd-soft with active BS No Clubbing cyanosis edema Skin-warm and dry diffuse erythema A & Oriented  Grossly normal sensory and motor function Blood pressure rechecked and it was as above  ECG demonstrates sinus rhythm at 98 Intervals 19/0833 Axiss 55  Assessment and  Plan

## 2012-08-11 NOTE — Assessment & Plan Note (Signed)
In the context of her erythema and her tachycardia, I worry if there is something secondary. We'll check her TSH as well as CBC

## 2012-08-11 NOTE — Assessment & Plan Note (Signed)
Severe hypertension that is poorly controlled. Her sleep apnea card has been recently read. Her blood pressure medication issues are as outlined above. We will resume lisinopril HCT as she also has a problem with edema. She'll need to recheck her metabolic profile in 3 weeks.

## 2012-08-11 NOTE — Assessment & Plan Note (Signed)
The patient's device was interrogated.  The information was reviewed. No changes were made in the programming.    

## 2012-09-01 ENCOUNTER — Other Ambulatory Visit (INDEPENDENT_AMBULATORY_CARE_PROVIDER_SITE_OTHER): Payer: BC Managed Care – PPO

## 2012-09-01 DIAGNOSIS — I495 Sick sinus syndrome: Secondary | ICD-10-CM

## 2012-09-01 LAB — BASIC METABOLIC PANEL
BUN: 11 mg/dL (ref 6–23)
CO2: 26 mEq/L (ref 19–32)
Calcium: 8.9 mg/dL (ref 8.4–10.5)
Chloride: 109 mEq/L (ref 96–112)
Creatinine, Ser: 0.7 mg/dL (ref 0.4–1.2)
GFR: 97.51 mL/min (ref >60.00)
Glucose, Bld: 81 mg/dL (ref 70–99)
Potassium: 3.9 mEq/L (ref 3.5–5.1)
Sodium: 142 mEq/L (ref 135–145)

## 2012-09-01 LAB — CBC WITH DIFFERENTIAL/PLATELET
Basophils Absolute: 0 10*3/uL (ref 0.0–0.1)
Eosinophils Absolute: 0.2 10*3/uL (ref 0.0–0.7)
Eosinophils Relative: 4 % (ref 0.0–5.0)
HCT: 37.9 % (ref 36.0–46.0)
Lymphocytes Relative: 40.9 % (ref 12.0–46.0)
Lymphs Abs: 2.1 10*3/uL (ref 0.7–4.0)
Monocytes Absolute: 0.4 10*3/uL (ref 0.1–1.0)
Monocytes Relative: 7.1 % (ref 3.0–12.0)
Neutro Abs: 2.4 10*3/uL (ref 1.4–7.7)
Platelets: 170 10*3/uL (ref 150.0–400.0)

## 2012-09-01 LAB — TSH: TSH: 0.76 u[IU]/mL (ref 0.35–5.50)

## 2012-11-14 ENCOUNTER — Other Ambulatory Visit: Payer: Self-pay

## 2012-11-14 ENCOUNTER — Ambulatory Visit (INDEPENDENT_AMBULATORY_CARE_PROVIDER_SITE_OTHER): Payer: BC Managed Care – PPO | Admitting: *Deleted

## 2012-11-14 DIAGNOSIS — Z950 Presence of cardiac pacemaker: Secondary | ICD-10-CM

## 2012-11-14 DIAGNOSIS — I495 Sick sinus syndrome: Secondary | ICD-10-CM

## 2012-11-15 LAB — REMOTE PACEMAKER DEVICE
ATRIAL PACING PM: 11
BATTERY VOLTAGE: 2.96 V
BRDY-0003RA: 125 {beats}/min

## 2012-12-07 ENCOUNTER — Encounter: Payer: Self-pay | Admitting: *Deleted

## 2012-12-20 ENCOUNTER — Encounter: Payer: Self-pay | Admitting: Internal Medicine

## 2013-02-13 ENCOUNTER — Encounter: Payer: Self-pay | Admitting: Internal Medicine

## 2013-02-13 ENCOUNTER — Ambulatory Visit (INDEPENDENT_AMBULATORY_CARE_PROVIDER_SITE_OTHER): Payer: BC Managed Care – PPO | Admitting: *Deleted

## 2013-02-13 DIAGNOSIS — I495 Sick sinus syndrome: Secondary | ICD-10-CM

## 2013-02-13 DIAGNOSIS — I498 Other specified cardiac arrhythmias: Secondary | ICD-10-CM

## 2013-02-13 DIAGNOSIS — R000 Tachycardia, unspecified: Secondary | ICD-10-CM

## 2013-02-14 LAB — REMOTE PACEMAKER DEVICE
AL AMPLITUDE: 4.3 mv
BAMS-0003: 70 {beats}/min
DEVICE MODEL PM: 2150303
RV LEAD IMPEDENCE PM: 460 Ohm
VENTRICULAR PACING PM: 1.5

## 2013-02-16 NOTE — Progress Notes (Signed)
Remote pacer received  

## 2013-02-17 ENCOUNTER — Encounter: Payer: Self-pay | Admitting: *Deleted

## 2013-05-17 ENCOUNTER — Encounter: Payer: Self-pay | Admitting: Internal Medicine

## 2013-05-17 ENCOUNTER — Ambulatory Visit (INDEPENDENT_AMBULATORY_CARE_PROVIDER_SITE_OTHER): Payer: BC Managed Care – PPO | Admitting: *Deleted

## 2013-05-17 DIAGNOSIS — R000 Tachycardia, unspecified: Secondary | ICD-10-CM

## 2013-05-17 DIAGNOSIS — I498 Other specified cardiac arrhythmias: Secondary | ICD-10-CM

## 2013-05-17 DIAGNOSIS — I495 Sick sinus syndrome: Secondary | ICD-10-CM

## 2013-05-17 LAB — MDC_IDC_ENUM_SESS_TYPE_REMOTE
Battery Voltage: 2.96 V
Brady Statistic RA Percent Paced: 21 %
Implantable Pulse Generator Serial Number: 2150303
Lead Channel Impedance Value: 410 Ohm
Lead Channel Impedance Value: 430 Ohm
Lead Channel Pacing Threshold Amplitude: 0.75 V
Lead Channel Pacing Threshold Pulse Width: 0.4 ms
Lead Channel Pacing Threshold Pulse Width: 0.4 ms
Lead Channel Sensing Intrinsic Amplitude: 2.8 mV
Lead Channel Setting Pacing Amplitude: 2 V
Lead Channel Setting Sensing Sensitivity: 0.5 mV
MDC IDC MSMT BATTERY REMAINING LONGEVITY: 96 mo
MDC IDC MSMT LEADCHNL RV PACING THRESHOLD AMPLITUDE: 1.25 V
MDC IDC MSMT LEADCHNL RV SENSING INTR AMPL: 7.4 mV
MDC IDC SESS DTM: 20150204073528
MDC IDC SET LEADCHNL RV PACING AMPLITUDE: 2.5 V
MDC IDC SET LEADCHNL RV PACING PULSEWIDTH: 0.4 ms
MDC IDC STAT BRADY AP VP PERCENT: 1.9 %
MDC IDC STAT BRADY AP VS PERCENT: 21 %
MDC IDC STAT BRADY AS VP PERCENT: 1 %
MDC IDC STAT BRADY AS VS PERCENT: 77 %
MDC IDC STAT BRADY RV PERCENT PACED: 1.9 %

## 2013-06-07 ENCOUNTER — Encounter: Payer: Self-pay | Admitting: *Deleted

## 2013-06-28 ENCOUNTER — Encounter: Payer: Self-pay | Admitting: Cardiology

## 2013-07-24 ENCOUNTER — Encounter: Payer: Self-pay | Admitting: *Deleted

## 2013-08-15 ENCOUNTER — Encounter: Payer: BC Managed Care – PPO | Admitting: Cardiology

## 2013-08-16 ENCOUNTER — Encounter (HOSPITAL_COMMUNITY): Payer: Self-pay | Admitting: Emergency Medicine

## 2013-08-16 ENCOUNTER — Emergency Department (HOSPITAL_COMMUNITY)
Admission: EM | Admit: 2013-08-16 | Discharge: 2013-08-17 | Disposition: A | Discharge status: Home / Self Care | Payer: BC Managed Care – PPO | Attending: Emergency Medicine | Admitting: Emergency Medicine

## 2013-08-16 DIAGNOSIS — Z9889 Other specified postprocedural states: Secondary | ICD-10-CM | POA: Insufficient documentation

## 2013-08-16 DIAGNOSIS — Z791 Long term (current) use of non-steroidal anti-inflammatories (NSAID): Secondary | ICD-10-CM | POA: Insufficient documentation

## 2013-08-16 DIAGNOSIS — I10 Essential (primary) hypertension: Secondary | ICD-10-CM | POA: Insufficient documentation

## 2013-08-16 DIAGNOSIS — Z79899 Other long term (current) drug therapy: Secondary | ICD-10-CM | POA: Insufficient documentation

## 2013-08-16 DIAGNOSIS — Z8669 Personal history of other diseases of the nervous system and sense organs: Secondary | ICD-10-CM | POA: Insufficient documentation

## 2013-08-16 DIAGNOSIS — Z862 Personal history of diseases of the blood and blood-forming organs and certain disorders involving the immune mechanism: Secondary | ICD-10-CM | POA: Insufficient documentation

## 2013-08-16 DIAGNOSIS — Z8719 Personal history of other diseases of the digestive system: Secondary | ICD-10-CM | POA: Insufficient documentation

## 2013-08-16 DIAGNOSIS — M542 Cervicalgia: Secondary | ICD-10-CM

## 2013-08-16 DIAGNOSIS — Z880 Allergy status to penicillin: Secondary | ICD-10-CM | POA: Insufficient documentation

## 2013-08-16 DIAGNOSIS — Z8639 Personal history of other endocrine, nutritional and metabolic disease: Secondary | ICD-10-CM | POA: Insufficient documentation

## 2013-08-16 DIAGNOSIS — Z950 Presence of cardiac pacemaker: Secondary | ICD-10-CM | POA: Insufficient documentation

## 2013-08-16 DIAGNOSIS — Z85820 Personal history of malignant melanoma of skin: Secondary | ICD-10-CM | POA: Insufficient documentation

## 2013-08-16 NOTE — ED Notes (Addendum)
Patient complaining right shoulder pain starting yesterday. States she has a history of back pain and is unable to get out of bed today. Denies injury. Patient on backboard with c-collar in place for transportation purposes only per EMS.

## 2013-08-16 NOTE — ED Notes (Signed)
Removed patient from backboard and c-collar with assistance. Patient crying and complaining of right shoulder pain. Denies injury.

## 2013-08-16 NOTE — ED Provider Notes (Signed)
CSN: [id number]     Arrival date & time 08/16/13  2022 History  This chart was scribed for Delora Fuel, MD by Zettie Pho, ED Scribe. This patient was seen in room APA03/APA03 and the patient's care was started at 12:01 AM.    Chief Complaint  Patient presents with  . Shoulder Pain   The history is provided by the patient. No language interpreter was used.   HPI Comments: Marilyn Ortiz is a 53 y.o. female who presents to the Emergency Department complaining of a constant, sharp pain, rated 7/10 currently, to the right shoulder that radiates down the right arm to the wrist and also radiates into the right side of the neck onset 4 days ago. Patient denies any potential injury or trauma to the area. Patient states that the pain is exacerbated with movement and touch/applied pressure. She states that she followed up with her PCP for these complaints 3 days ago and was given diclofenac and tramadol, but without significant relief of her symptoms. Patient states that she was taken off of the diclofenac due to labs that indicated some renal issues. Patient denies history of similar issues, but reports a history of back problems with lumbar disc surgery. She states that she received injections (patient does not specify what) in the neck about one month ago after an EMG (performed by her neurosurgeon in [place]). She denies numbness, weakness. Patient has allergies to codeine, morphine and related drugs, and penicillins. Patient has history of HTN and hyperlipidemia.   Past Medical History  Diagnosis Date  . Deglutition syncope   . HTN (hypertension)   . Chronic headaches   . OSA (obstructive sleep apnea)   . Dilation of esophagus   . Hyperlipidemia   . Skin cancer (melanoma) ? 2002    Back   Past Surgical History  Procedure Laterality Date  . Pacemaker insertion      St. Jude Accent 2210  . Tubal ligation    . Knee surgery      right  . Esophageal dilation    . Cesarean section      x 2   . Lumbar disc surgery      L4  . Vesicovaginal fistula closure w/ tah    . Bladder suspension    . Abdominal hysterectomy     Family History  Problem Relation Age of Onset  . Allergies Maternal Grandmother   . Heart disease      paternal side  . Rheum arthritis Maternal Grandfather   . Ovarian cancer Mother    History  Substance Use Topics  . Smoking status: Never Smoker   . Smokeless tobacco: Never Used  . Alcohol Use: Yes     Comment: wine on occ   OB History   Grav Para Term Preterm Abortions TAB SAB Ect Mult Living                 Review of Systems  Musculoskeletal: Positive for arthralgias.  Neurological: Negative for weakness and numbness.  All other systems reviewed and are negative.     Allergies  Codeine; Morphine and related; and Penicillins  Home Medications   Prior to Admission medications   Medication Sig Start Date End Date Taking? Authorizing Provider  diclofenac (VOLTAREN) 75 MG EC tablet Take 75 mg by mouth 2 (two) times daily.   Yes Historical Provider, MD  Doxylamine Succinate, Sleep, (UNISOM) 25 MG tablet Take 50 mg by mouth at bedtime as needed for sleep.  Yes Historical Provider, MD  lisinopril-hydrochlorothiazide (PRINZIDE,ZESTORETIC) 20-25 MG per tablet Take 1 tablet by mouth daily. 08/11/12  Yes Deboraha Sprang, MD  nebivolol (BYSTOLIC) 5 MG tablet Take 5 mg by mouth daily.   Yes Historical Provider, MD  tiZANidine (ZANAFLEX) 4 MG tablet Take 4-8 mg by mouth at bedtime.   Yes Historical Provider, MD  traMADol (ULTRAM) 50 MG tablet Take 50 mg by mouth 4 (four) times daily as needed. pain   Yes Historical Provider, MD   Triage Vitals: BP 139/80  Pulse 74  Temp(Src) 98.8 F (37.1 C) (Oral)  Resp 14  Ht 5\' 4"  (1.626 m)  Wt 218 lb (98.884 kg)  BMI 37.40 kg/m2  SpO2 98%  Physical Exam  Nursing note and vitals reviewed. Constitutional: She is oriented to person, place, and time. She appears well-developed and well-nourished. No distress.   HENT:  Head: Normocephalic and atraumatic.  Eyes: Conjunctivae and EOM are normal. Pupils are equal, round, and reactive to light.  Neck: Normal range of motion. Neck supple. No JVD present.  Moderate tenderness to the mid and lower cervical area with mild, right paracervical spasm.   Cardiovascular: Normal rate, regular rhythm and normal heart sounds.   No murmur heard. Pulmonary/Chest: Effort normal and breath sounds normal. No respiratory distress. She has no wheezes. She has no rales.  Abdominal: Soft. Bowel sounds are normal. She exhibits no distension and no mass. There is no tenderness.  Musculoskeletal: Normal range of motion. She exhibits no edema.  Mild tenderness in the right anterior deltoid groove with mild rotator cuff impingement signs, but full passive range of motion.   Lymphadenopathy:    She has no cervical adenopathy.  Neurological: She is alert and oriented to person, place, and time. She has normal reflexes. No cranial nerve deficit. Coordination normal.  Normal strength and sensation. Distal neurovascular intact.   Skin: Skin is warm and dry. No rash noted.  Psychiatric: She has a normal mood and affect. Her behavior is normal. Thought content normal.    ED Course  Procedures (including critical care time)  DIAGNOSTIC STUDIES: Oxygen Saturation is 98% on room air, normal by my interpretation.    COORDINATION OF CARE: 12:10 AM- Ordered an x-ray of the right shoulder. Ordered Percocet and Flexeril to manage symptoms. Advised patient to follow up with her neurosurgeon. Discussed treatment plan with patient at bedside and patient verbalized agreement.    Imaging Review Dg Shoulder Right  08/17/2013   CLINICAL DATA:  Right shoulder pain after moving heavy object  EXAM: RIGHT SHOULDER - 2+ VIEW  COMPARISON:  None.  FINDINGS: There is no evidence of fracture or dislocation. There is no evidence of arthropathy or other focal bone abnormality. Soft tissues are  unremarkable.  IMPRESSION: No acute osseous injury of the right shoulder.   Electronically Signed   By: Kathreen Devoid   On: 08/17/2013 01:01   MDM   Final diagnoses:  Neck pain    Pain he rates of the neck and shoulder and arm meds seems to be centered in the neck consistent with cervical radiculopathy. No neurologic deficit is identified. Some findings suggestive of possible rotator cuff injury are present but I think that the main problem is in the neck. I have discussed this with the patient. She cannot have MRI because of a pacemaker. She is oriented to the care of a neurosurgeon and apparently he is alert he had an EMG. She probably should be considered for CT myelogram  but this can be set up by her neurosurgeon. In the ED, she is given a dose of cyclobenzaprine and oxycodone have acetaminophen with reasonable relief of pain. X-rays of the shoulder are unremarkable although this does not rule out rotator cuff injury. She cannot be given NSAIDs because of recent kidney injury from NSAIDs. She is discharged with prescriptions for oxycodone have acetaminophen and cyclobenzaprine and she is to followup with her neurosurgeon.  I personally performed the services described in this documentation, which was scribed in my presence. The recorded information has been reviewed and is accurate.      Delora Fuel, MD [id number]

## 2013-08-16 NOTE — ED Notes (Signed)
Pt reports right neck & shoulder pain that started Sunday. Pt saw PCP on Monday, was given pain meds & muscle relaxer. Pt states no improvement in her condition.

## 2013-08-17 ENCOUNTER — Emergency Department (HOSPITAL_COMMUNITY): Payer: BC Managed Care – PPO

## 2013-08-17 MED ORDER — CYCLOBENZAPRINE HCL 10 MG PO TABS
10.0000 mg | ORAL_TABLET | Freq: Once | ORAL | Status: AC
  Administered 2013-08-17: 10 mg via ORAL
  Filled 2013-08-17: qty 1

## 2013-08-17 MED ORDER — OXYCODONE-ACETAMINOPHEN 7.5-325 MG PO TABS: 1.0000 | ORAL_TABLET | ORAL | Status: DC | PRN

## 2013-08-17 MED ORDER — OXYCODONE-ACETAMINOPHEN 5-325 MG PO TABS
1.0000 | ORAL_TABLET | Freq: Once | ORAL | Status: AC
  Administered 2013-08-17: 1 via ORAL
  Filled 2013-08-17: qty 1

## 2013-08-17 MED ORDER — CYCLOBENZAPRINE HCL 10 MG PO TABS: 10.0000 mg | ORAL_TABLET | Freq: Two times a day (BID) | ORAL | Status: DC | PRN

## 2013-08-17 MED ORDER — OXYCODONE-ACETAMINOPHEN 5-325 MG PO TABS: 1.0000 | ORAL_TABLET | ORAL | Status: DC | PRN

## 2013-08-17 NOTE — Discharge Instructions (Signed)
Cervical Radiculopathy Cervical radiculopathy happens when a nerve in the neck is pinched or bruised by a slipped (herniated) disk or by arthritic changes in the bones of the cervical spine. This can occur due to an injury or as part of the normal aging process. Pressure on the cervical nerves can cause pain or numbness that runs from your neck all the way down into your arm and fingers. CAUSES  There are many possible causes, including:  Injury.  Muscle tightness in the neck from overuse.  Swollen, painful joints (arthritis).  Breakdown or degeneration in the bones and joints of the spine (spondylosis) due to aging.  Bone spurs that may develop near the cervical nerves. SYMPTOMS  Symptoms include pain, weakness, or numbness in the affected arm and hand. Pain can be severe or irritating. Symptoms may be worse when extending or turning the neck. DIAGNOSIS  Your caregiver will ask about your symptoms and do a physical exam. He or she may test your strength and reflexes. X-rays, CT scans, and MRI scans may be needed in cases of injury or if the symptoms do not go away after a period of time. Electromyography (EMG) or nerve conduction testing may be done to study how your nerves and muscles are working. TREATMENT  Your caregiver may recommend certain exercises to help relieve your symptoms. Cervical radiculopathy can, and often does, get better with time and treatment. If your problems continue, treatment options may include:  Wearing a soft collar for short periods of time.  Physical therapy to strengthen the neck muscles.  Medicines, such as nonsteroidal anti-inflammatory drugs (NSAIDs), oral corticosteroids, or spinal injections.  Surgery. Different types of surgery may be done depending on the cause of your problems. HOME CARE INSTRUCTIONS   Put ice on the affected area.  Put ice in a plastic bag.  Place a towel between your skin and the bag.  Leave the ice on for 15-20 minutes,  03-04 times a day or as directed by your caregiver.  If ice does not help, you can try using heat. Take a warm shower or bath, or use a hot water bottle as directed by your caregiver.  You may try a gentle neck and shoulder massage.  Use a flat pillow when you sleep.  Only take over-the-counter or prescription medicines for pain, discomfort, or fever as directed by your caregiver.  If physical therapy was prescribed, follow your caregiver's directions.  If a soft collar was prescribed, use it as directed. SEEK IMMEDIATE MEDICAL CARE IF:   Your pain gets much worse and cannot be controlled with medicines.  You have weakness or numbness in your hand, arm, face, or leg.  You have a high fever or a stiff, rigid neck.  You lose bowel or bladder control (incontinence).  You have trouble with walking, balance, or speaking. MAKE SURE YOU:   Understand these instructions.  Will watch your condition.  Will get help right away if you are not doing well or get worse. Document Released: 12/23/2000 Document Revised: 06/22/2011 Document Reviewed: 11/11/2010 [facility] Patient Information 2014 [place], [place].  Acetaminophen; Oxycodone tablets What is this medicine? ACETAMINOPHEN; OXYCODONE (a set a MEE noe fen; ox i KOE done) is a pain reliever. It is used to treat mild to moderate pain. This medicine may be used for other purposes; ask your health care provider or pharmacist if you have questions. COMMON BRAND NAME(S): Endocet, Magnacet, Narvox, Percocet, Perloxx, Primalev, Primlev, Roxicet, Xolox What should I tell my health care  provider before I take this medicine? They need to know if you have any of these conditions: -brain tumor -Crohn's disease, inflammatory bowel disease, or ulcerative colitis -drug abuse or addiction -head injury -heart or circulation problems -if you often drink alcohol -kidney disease or problems going to the bathroom -liver disease -lung disease, asthma,  or breathing problems -an unusual or allergic reaction to acetaminophen, oxycodone, other opioid analgesics, other medicines, foods, dyes, or preservatives -pregnant or trying to get pregnant -breast-feeding How should I use this medicine? Take this medicine by mouth with a full glass of water. Follow the directions on the prescription label. Take your medicine at regular intervals. Do not take your medicine more often than directed. Talk to your pediatrician regarding the use of this medicine in children. Special care may be needed. Patients over 57 years old may have a stronger reaction and need a smaller dose. Overdosage: If you think you have taken too much of this medicine contact a poison control center or emergency room at once. NOTE: This medicine is only for you. Do not share this medicine with others. What if I miss a dose? If you miss a dose, take it as soon as you can. If it is almost time for your next dose, take only that dose. Do not take double or extra doses. What may interact with this medicine? -alcohol -antihistamines -barbiturates like amobarbital, butalbital, butabarbital, methohexital, pentobarbital, phenobarbital, thiopental, and secobarbital -benztropine -drugs for bladder problems like solifenacin, trospium, oxybutynin, tolterodine, hyoscyamine, and methscopolamine -drugs for breathing problems like ipratropium and tiotropium -drugs for certain stomach or intestine problems like propantheline, homatropine methylbromide, glycopyrrolate, atropine, belladonna, and dicyclomine -general anesthetics like etomidate, ketamine, nitrous oxide, propofol, desflurane, enflurane, halothane, isoflurane, and sevoflurane -medicines for depression, anxiety, or psychotic disturbances -medicines for sleep -muscle relaxants -naltrexone -narcotic medicines (opiates) for pain -phenothiazines like perphenazine, thioridazine, chlorpromazine, mesoridazine, fluphenazine, prochlorperazine,  promazine, and trifluoperazine -scopolamine -tramadol -trihexyphenidyl This list may not describe all possible interactions. Give your health care provider a list of all the medicines, herbs, non-prescription drugs, or dietary supplements you use. Also tell them if you smoke, drink alcohol, or use illegal drugs. Some items may interact with your medicine. What should I watch for while using this medicine? Tell your doctor or health care professional if your pain does not go away, if it gets worse, or if you have new or a different type of pain. You may develop tolerance to the medicine. Tolerance means that you will need a higher dose of the medication for pain relief. Tolerance is normal and is expected if you take this medicine for a long time. Do not suddenly stop taking your medicine because you may develop a severe reaction. Your body becomes used to the medicine. This does NOT mean you are addicted. Addiction is a behavior related to getting and using a drug for a non-medical reason. If you have pain, you have a medical reason to take pain medicine. Your doctor will tell you how much medicine to take. If your doctor wants you to stop the medicine, the dose will be slowly lowered over time to avoid any side effects. You may get drowsy or dizzy. Do not drive, use machinery, or do anything that needs mental alertness until you know how this medicine affects you. Do not stand or sit up quickly, especially if you are an older patient. This reduces the risk of dizzy or fainting spells. Alcohol may interfere with the effect of this medicine. Avoid alcoholic  drinks. There are different types of narcotic medicines (opiates) for pain. If you take more than one type at the same time, you may have more side effects. Give your health care provider a list of all medicines you use. Your doctor will tell you how much medicine to take. Do not take more medicine than directed. Call emergency for help if you have  problems breathing. The medicine will cause constipation. Try to have a bowel movement at least every 2 to 3 days. If you do not have a bowel movement for 3 days, call your doctor or health care professional. Do not take Tylenol (acetaminophen) or medicines that have acetaminophen with this medicine. Too much acetaminophen can be very dangerous. Many nonprescription medicines contain acetaminophen. Always read the labels carefully to avoid taking more acetaminophen. What side effects may I notice from receiving this medicine? Side effects that you should report to your doctor or health care professional as soon as possible: -allergic reactions like skin rash, itching or hives, swelling of the face, lips, or tongue -breathing difficulties, wheezing -confusion -light headedness or fainting spells -severe stomach pain -unusually weak or tired -yellowing of the skin or the whites of the eyes  Side effects that usually do not require medical attention (report to your doctor or health care professional if they continue or are bothersome): -dizziness -drowsiness -nausea -vomiting This list may not describe all possible side effects. Call your doctor for medical advice about side effects. You may report side effects to FDA at 1-800-FDA-1088. Where should I keep my medicine? Keep out of the reach of children. This medicine can be abused. Keep your medicine in a safe place to protect it from theft. Do not share this medicine with anyone. Selling or giving away this medicine is dangerous and against the law. Store at room temperature between 20 and 25 degrees C (68 and 77 degrees F). Keep container tightly closed. Protect from light. This medicine may cause accidental overdose and death if it is taken by other adults, children, or pets. Flush any unused medicine down the toilet to reduce the chance of harm. Do not use the medicine after the expiration date. NOTE: This sheet is a summary. It may not cover  all possible information. If you have questions about this medicine, talk to your doctor, pharmacist, or health care provider.  2014, Elsevier/Gold Standard. (2012-11-21 13:17:35)  Cyclobenzaprine tablets What is this medicine? CYCLOBENZAPRINE (sye kloe BEN za preen) is a muscle relaxer. It is used to treat muscle pain, spasms, and stiffness. This medicine may be used for other purposes; ask your health care provider or pharmacist if you have questions. COMMON BRAND NAME(S): Fexmid, Flexeril What should I tell my health care provider before I take this medicine? They need to know if you have any of these conditions: -heart disease, irregular heartbeat, or previous heart attack -liver disease -thyroid problem -an unusual or allergic reaction to cyclobenzaprine, tricyclic antidepressants, lactose, other medicines, foods, dyes, or preservatives -pregnant or trying to get pregnant -breast-feeding How should I use this medicine? Take this medicine by mouth with a glass of water. Follow the directions on the prescription label. If this medicine upsets your stomach, take it with food or milk. Take your medicine at regular intervals. Do not take it more often than directed. Talk to your pediatrician regarding the use of this medicine in children. Special care may be needed. Overdosage: If you think you have taken too much of this medicine contact a poison control  center or emergency room at once. NOTE: This medicine is only for you. Do not share this medicine with others. What if I miss a dose? If you miss a dose, take it as soon as you can. If it is almost time for your next dose, take only that dose. Do not take double or extra doses. What may interact with this medicine? Do not take this medicine with any of the following medications: -certain medicines for fungal infections like fluconazole, itraconazole, ketoconazole, posaconazole,  voriconazole -cisapride -dofetilide -dronedarone -droperidol -flecainide -grepafloxacin -halofantrine -levomethadyl -MAOIs like Carbex, Eldepryl, Marplan, Nardil, and Parnate -nilotinib -pimozide -probucol -sertindole -thioridazine -ziprasidone  This medicine may also interact with the following medications: -abarelix -alcohol -certain medicines for cancer -certain medicines for depression, anxiety, or psychotic disturbances -certain medicines for infection like alfuzosin, chloroquine, clarithromycin, levofloxacin, mefloquine, pentamidine, troleandomycin -certain medicines for an irregular heart beat -certain medicines used for sleep or numbness during surgery or procedure -contrast dyes -dolasetron -guanethidine -methadone -octreotide -ondansetron -other medicines that prolong the QT interval (cause an abnormal heart rhythm) -palonosetron -phenothiazines like chlorpromazine, mesoridazine, prochlorperazine, thioridazine -tramadol -vardenafil This list may not describe all possible interactions. Give your health care provider a list of all the medicines, herbs, non-prescription drugs, or dietary supplements you use. Also tell them if you smoke, drink alcohol, or use illegal drugs. Some items may interact with your medicine. What should I watch for while using this medicine? Check with your doctor or health care professional if your condition does not improve within 1 to 3 weeks. You may get drowsy or dizzy when you first start taking the medicine or change doses. Do not drive, use machinery, or do anything that may be dangerous until you know how the medicine affects you. Stand or sit up slowly. Your mouth may get dry. Drinking water, chewing sugarless gum, or sucking on hard candy may help. What side effects may I notice from receiving this medicine? Side effects that you should report to your doctor or health care professional as soon as possible: -allergic reactions like  skin rash, itching or hives, swelling of the face, lips, or tongue -chest pain -fast heartbeat -hallucinations -seizures -vomiting Side effects that usually do not require medical attention (report to your doctor or health care professional if they continue or are bothersome): -headache This list may not describe all possible side effects. Call your doctor for medical advice about side effects. You may report side effects to FDA at 1-800-FDA-1088. Where should I keep my medicine? Keep out of the reach of children. Store at room temperature between 15 and 30 degrees C (59 and 86 degrees F). Keep container tightly closed. Throw away any unused medicine after the expiration date. NOTE: This sheet is a summary. It may not cover all possible information. If you have questions about this medicine, talk to your doctor, pharmacist, or health care provider.  2014, Elsevier/Gold Standard. (2012-10-25 12:48:19)

## 2013-08-17 NOTE — ED Notes (Signed)
Pt carried out by wheelchair. Pt able to get self into car. No questions. Pt received discharge papers & dispense medications.

## 2013-08-21 ENCOUNTER — Encounter: Payer: Self-pay | Admitting: Gastroenterology

## 2013-08-21 ENCOUNTER — Telehealth: Payer: Self-pay | Admitting: Gastroenterology

## 2013-08-21 ENCOUNTER — Ambulatory Visit: Payer: BC Managed Care – PPO | Admitting: Gastroenterology

## 2013-08-21 NOTE — Telephone Encounter (Signed)
Pt was a no show

## 2013-08-21 NOTE — Telephone Encounter (Signed)
Mailed letter °

## 2013-09-05 ENCOUNTER — Encounter: Payer: Self-pay | Admitting: Cardiology

## 2013-09-05 ENCOUNTER — Ambulatory Visit (INDEPENDENT_AMBULATORY_CARE_PROVIDER_SITE_OTHER): Payer: BC Managed Care – PPO | Admitting: Cardiology

## 2013-09-05 VITALS — BP 126/82 | HR 85 | Ht 60.0 in | Wt 211.0 lb

## 2013-09-05 DIAGNOSIS — R55 Syncope and collapse: Secondary | ICD-10-CM

## 2013-09-05 DIAGNOSIS — I10 Essential (primary) hypertension: Secondary | ICD-10-CM

## 2013-09-05 DIAGNOSIS — Z950 Presence of cardiac pacemaker: Secondary | ICD-10-CM

## 2013-09-05 DIAGNOSIS — I495 Sick sinus syndrome: Secondary | ICD-10-CM

## 2013-09-05 LAB — MDC_IDC_ENUM_SESS_TYPE_INCLINIC
Brady Statistic RA Percent Paced: 24 %
Brady Statistic RV Percent Paced: 2.6 %
Date Time Interrogation Session: 20150526133824
Implantable Pulse Generator Model: 2210
Lead Channel Impedance Value: 430 Ohm
Lead Channel Impedance Value: 450 Ohm
Lead Channel Pacing Threshold Amplitude: 0.75 V
Lead Channel Pacing Threshold Pulse Width: 0.4 ms
Lead Channel Pacing Threshold Pulse Width: 0.4 ms
Lead Channel Sensing Intrinsic Amplitude: 3.1 mV
Lead Channel Sensing Intrinsic Amplitude: 4.9 mV
Lead Channel Setting Pacing Amplitude: 2.5 V
Lead Channel Setting Sensing Sensitivity: 0.5 mV
MDC IDC MSMT BATTERY REMAINING LONGEVITY: 127.2 mo
MDC IDC MSMT BATTERY VOLTAGE: 2.96 V
MDC IDC MSMT LEADCHNL RV PACING THRESHOLD AMPLITUDE: 1.25 V
MDC IDC PG SERIAL: 2150303
MDC IDC SET LEADCHNL RA PACING AMPLITUDE: 2 V
MDC IDC SET LEADCHNL RV PACING PULSEWIDTH: 0.4 ms

## 2013-09-05 NOTE — Progress Notes (Signed)
ELECTROPHYSIOLOGY OFFICE NOTE   Patient ID: Marilyn Ortiz MRN: [id number], DOB/AGE: 15-Dec-1960   Date of Visit: 09/05/2013  Primary Physician: Delphina Cahill, MD Primary Cardiologist / EP: Jolyn Nap, MD Reason for Visit: EP/device follow-up  History of Present Illness  Marilyn Ortiz is a 53 y.o. 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 Since last being seen in our clinic, she reports she is doing well and has no complaints. She denies chest pain or shortness of breath. She denies palpitations, dizziness, near syncope or syncope. She denies LE swelling, orthopnea, PND or recent weight gain. She is compliant and tolerating medications without difficulty. She is compliant with remote pacer follow-up.  Past Medical History Past Medical History  Diagnosis Date  . Deglutition syncope   . HTN (hypertension)   . Chronic headaches   . OSA (obstructive sleep apnea)   . Dilation of esophagus   . Hyperlipidemia   . Skin cancer (melanoma) ? 2002    Back    Past Surgical History Past Surgical History  Procedure Laterality Date  . Pacemaker insertion      [employer]. Jude Accent 2210  . Tubal ligation    . Knee surgery      right  . Esophageal dilation    . Cesarean section      x 2  . Lumbar disc surgery      L4  . Vesicovaginal fistula closure w/ tah    . Bladder suspension    . Abdominal hysterectomy      Allergies/Intolerances Allergies  Allergen Reactions  . Codeine   . Morphine And Related   . Penicillins     Current Home Medications Current Outpatient Prescriptions  Medication Sig Dispense Refill  . Doxylamine Succinate, Sleep, (UNISOM) 25 MG tablet Take 50 mg by mouth at bedtime as needed for sleep.       [name] lisinopril-hydrochlorothiazide (PRINZIDE,ZESTORETIC) 20-25 MG per tablet Take 1 tablet by mouth daily.  30 tablet  6  . nebivolol (BYSTOLIC) 5 MG tablet Take 5 mg by mouth daily.       [name] tiZANidine (ZANAFLEX) 4 MG tablet Take 4-8 mg by mouth at bedtime.       No current facility-administered medications for this visit.    Social History History   Social History  . Marital Status: Married    Spouse Name: Perina Salvaggio    Number of Children: 3  . Years of Education: N/A   Occupational History  . Childcare     Full-time   Social History Main Topics  . Smoking status: Never Smoker   . Smokeless tobacco: Never Used  . Alcohol Use: Yes     Comment: wine on occ  . Drug Use: No  . Sexual Activity: Not on file   Other Topics Concern  . Not on file   Social History Narrative  . No narrative on file     Review of Systems General: No chills, fever, night sweats or weight changes Cardiovascular: No chest pain, dyspnea on exertion, edema, orthopnea, palpitations, paroxysmal nocturnal dyspnea Dermatological: No rash, lesions or masses Respiratory: No cough, dyspnea Urologic: No hematuria, dysuria Abdominal: No nausea, vomiting, diarrhea, bright red blood per rectum, melena, or hematemesis Neurologic: No visual changes, weakness, changes in mental status All other systems reviewed and are otherwise negative except as noted above.  Physical Exam Vitals: Blood pressure 126/82, pulse 85, height 5' (1.524 m), weight  211 lb (95.709 kg).  General: Well developed, well appearing 53 y.o. female in no acute distress. HEENT: Normocephalic, atraumatic. EOMs intact. Sclera nonicteric. Oropharynx clear.  Neck: Supple. No JVD. Lungs: Respirations regular and unlabored, CTA bilaterally. No wheezes, rales or rhonchi. Heart: RRR. S1, S2 present. No murmurs, rub, S3 or S4. Abdomen: Soft, non-distended.   Extremities: No clubbing, cyanosis or edema. DP/PT/Radials 2+ and equal bilaterally. Psych: Normal affect. Neuro: Alert and oriented X 3. Moves all extremities spontaneously. Skin: Left upper chest / implant site intact and well healed.   Diagnostics 12-lead ECG today - NSR  at 85 bpm with normal intervals and nonspecific T wave abnormality; PR 170 QRS 74 QTc 452 Device interrogation today - Normal device function. Thresholds, sensing, impedances consistent with previous measurements. Device programmed to maximize longevity. 6 mode switch episodes, longest 10 seonds, EGMs show SVT. No high ventricular rates noted. Device programmed at appropriate safety margins. Histogram distribution appropriate for patient activity level. Device programmed to optimize intrinsic conduction. Estimated longevity 8.3 - 10.6 years.  Assessment and Plan 1. Deglutition syncope 2. Sinus node dysfunction s/p PPM implant 3. Hypertension Ms. Washburn is stable from a cardiac standpoint. She has not had any recurrent syncope. Her pacemaker function is normal. There were no programming changes made today. She is normotensive on her current antihypertensive regimen which is followed by her PCP. She will continue routine remote PPM follow-up every 3 months. She will return for follow-up with Dr. Caryl Comes in one year.   Darrick Huntsman, PA-C 09/05/2013, 9:59 AM

## 2013-09-05 NOTE — Patient Instructions (Addendum)
Remote monitoring is used to monitor your Pacemaker of ICD from home. This monitoring reduces the number of office visits required to check your device to one time per year. It allows [place] to keep an eye on the functioning of your device to ensure it is working properly. You are scheduled for a device check from home on 12/06/13. You may send your transmission at any time that day. If you have a wireless device, the transmission will be sent automatically. After your physician reviews your transmission, you will receive a postcard with your next transmission date.  Your physician wants you to follow-up in: 1 year with Dr. Gari Crown will receive a reminder letter in the mail two months in advance. If you don't receive a letter, please call our office to schedule the follow-up appointment.

## 2013-09-10 ENCOUNTER — Other Ambulatory Visit: Payer: Self-pay | Admitting: Internal Medicine

## 2013-09-12 ENCOUNTER — Encounter: Payer: Self-pay | Admitting: Internal Medicine

## 2013-09-18 ENCOUNTER — Ambulatory Visit (INDEPENDENT_AMBULATORY_CARE_PROVIDER_SITE_OTHER): Payer: BC Managed Care – PPO | Admitting: Gastroenterology

## 2013-09-18 ENCOUNTER — Encounter (INDEPENDENT_AMBULATORY_CARE_PROVIDER_SITE_OTHER): Payer: Self-pay

## 2013-09-18 ENCOUNTER — Telehealth: Payer: Self-pay | Admitting: Gastroenterology

## 2013-09-18 ENCOUNTER — Encounter: Payer: Self-pay | Admitting: Gastroenterology

## 2013-09-18 VITALS — BP 114/82 | HR 81 | Temp 97.7°F | Resp 18 | Ht 64.0 in | Wt 213.0 lb

## 2013-09-18 DIAGNOSIS — D649 Anemia, unspecified: Secondary | ICD-10-CM

## 2013-09-18 DIAGNOSIS — K625 Hemorrhage of anus and rectum: Secondary | ICD-10-CM

## 2013-09-18 NOTE — Telephone Encounter (Signed)
Please call and speak to Dr. Juel Burrow nurse. Find out if there are plans for repeat Met-7 for abnormal BUN/Creatinine in the near future or referral for renal insufficiency.   Patient did not seem to have understanding regarding this matter when seen today.   As of right now she is not scheduled for endoscopic evaluation because she wants [place] to review her last EGD/TCS first.

## 2013-09-18 NOTE — Patient Instructions (Signed)
1. I will review your records and we will make a decision regarding colonoscopy. We will call you once records received/reviewed.

## 2013-09-18 NOTE — Progress Notes (Signed)
Primary Care Physician:  Delphina Cahill, MD  Primary Gastroenterologist:  Garfield Cornea, MD   Chief Complaint  Patient presents with  . Referral  . Rectal Bleeding  . Hemorrhoids  . Hematochezia  . Anemia    HPI:  Marilyn Ortiz is a 53 y.o. female here at the request of Dr. Wende Neighbors for further evaluation of anemia, blood in the stool. Patient is a new patient here. She reports her last Colonoscopy and EGD about 5 years ago in [facility]. She reports a normal colonoscopy and suggest a followup of 10 years. She believes she may have had ulcers related to recurrent vomiting at time of her last endoscopy. She has a history of deglutition syncope requiring pacemaker about 5 years ago.  Back in April she had a one-week history of hematochezia on the toilet tissue only. Denies any melena. Symptoms have subsequently resolved. Shortly after the bleeding she developed her "first hemorrhoid". She self medicated with over-the-counter hydrocortisone. No abdominal pain. BM regular. Patient reports no longer on Voltaren, Dr. Nevada Crane stopped it. She denies any heartburn. Occasionally vomits with swallowing related to her previous diagnosis of deglutition syndrome, she will note pacemaker "kicks in" along with the swallowing issue.   Current Outpatient Prescriptions  Medication Sig Dispense Refill  . Doxylamine Succinate, Sleep, (UNISOM) 25 MG tablet Take 50 mg by mouth at bedtime as needed for sleep.       [name] lisinopril-hydrochlorothiazide (PRINZIDE,ZESTORETIC) 20-25 MG per tablet TAKE 1 TABLET BY MOUTH DAILY.  30 tablet  6  . nebivolol (BYSTOLIC) 5 MG tablet Take 5 mg by mouth daily.      [name] tiZANidine (ZANAFLEX) 4 MG tablet Take 4-8 mg by mouth at bedtime.       No current facility-administered medications for this visit.    Allergies as of 09/18/2013 - Review Complete 09/18/2013  Allergen Reaction Noted  . Codeine    . Morphine and related  07/22/2010  . Penicillins      Past Medical History   Diagnosis Date  . Deglutition syncope   . HTN (hypertension)   . Chronic headaches   . OSA (obstructive sleep apnea)   . Dilation of esophagus   . Hyperlipidemia   . Skin cancer (melanoma) ? 2002    Back    Past Surgical History  Procedure Laterality Date  . Pacemaker insertion      St. Jude Accent 2210  . Tubal ligation    . Knee surgery      right  . Esophageal dilation    . Cesarean section      x 2  . Lumbar disc surgery      L4  . Vesicovaginal fistula closure w/ tah    . Bladder suspension    . Abdominal hysterectomy      Family History  Problem Relation Age of Onset  . Allergies Maternal Grandmother   . Heart disease      paternal side  . Rheum arthritis Maternal Grandfather   . Ovarian cancer Mother     stage IV, survival  . Colon cancer Other     paternal aunt, age late 59s/  . Lung cancer Neg Hx   . Breast cancer Mother     History   Social History  . Marital Status: Married    Spouse Name: Felipe Cabell    Number of Children: 3  . Years of Education: N/A   Occupational History  . Childcare     Full-time  Social History Main Topics  . Smoking status: Never Smoker   . Smokeless tobacco: Never Used  . Alcohol Use: Yes     Comment: wine on occ  . Drug Use: No  . Sexual Activity: Not on file   Other Topics Concern  . Not on file   Social History Narrative  . No narrative on file      ROS:  General: Negative for anorexia, weight loss, fever, chills, fatigue, weakness. Eyes: Negative for vision changes.  ENT: Negative for hoarseness, difficulty swallowing , nasal congestion. CV: Negative for chest pain, angina, palpitations, dyspnea on exertion, peripheral edema.  Respiratory: Negative for dyspnea at rest, dyspnea on exertion, cough, sputum, wheezing.  GI: See history of present illness. GU:  Negative for dysuria, hematuria, urinary incontinence, urinary frequency, nocturnal urination.  MS: Negative for joint pain, low back pain.   Derm: Negative for rash or itching.  Neuro: Negative for weakness, abnormal sensation, seizure, frequent headaches, memory loss, confusion.  Psych: Negative for anxiety, depression, suicidal ideation, hallucinations.  Endo: Negative for unusual weight change.  Heme: Negative for bruising or bleeding. Allergy: Negative for rash or hives.    Physical Examination:  BP 114/82  Pulse 81  Temp(Src) 97.7 F (36.5 C) (Oral)  Resp 18  Ht 5\' 4"  (1.626 m)  Wt 213 lb (96.616 kg)  BMI 36.54 kg/m2   General: Well-nourished, well-developed in no acute distress.  Head: Normocephalic, atraumatic.   Eyes: Conjunctiva pink, no icterus. Mouth: Oropharyngeal mucosa moist and pink , no lesions erythema or exudate. Neck: Supple without thyromegaly, masses, or lymphadenopathy.  Lungs: Clear to auscultation bilaterally.  Heart: Regular rate and rhythm, no murmurs rubs or gallops.  Abdomen: Bowel sounds are normal, nontender, nondistended, no hepatosplenomegaly or masses, no abdominal bruits or    hernia , no rebound or guarding.   Rectal: not performed, patient declined Extremities: No lower extremity edema. No clubbing or deformities.  Neuro: Alert and oriented x 4 , grossly normal neurologically.  Skin: Warm and dry, no rash or jaundice.   Psych: Alert and cooperative, normal mood and affect.  Labs from 07/20/2013. Hemoglobin 11, hematocrit 32.1, MCV 84.7, platelets 198,000, white blood cell count 6300, iron 79, TIBC 308, and her saturations 26%, ferritin 311, BUN 38, creatinine 1.36  Labs from 08/23/2013 BUN 50, creatinine 2.24, hemoglobin 11.3, hematocrit 34.9, platelets 257,000

## 2013-09-18 NOTE — Assessment & Plan Note (Signed)
53 year old lady who presents for further evaluation of recent rectal bleeding, anemia. Patient describes one-week history of small volume toilet tissue hematochezia. She denies any melena. Long history of swallowing issues, diagnosed with deglutition syncope requiring pacemaker about 5 years ago. Overall she does well from that standpoint. At this time, patient is without any symptoms. It is noted that her Voltaren was stopped back in April. We have requested records of her most recent endoscopy and colonoscopy for review. After the patient left the office I received copies of her most recent labs which indicate a worsening BUN and creatinine, hemoglobin slightly improved the previous iron studies not indicative of iron deficiency.  Patient is opposed to having colonoscopy and/or endoscopy unless we felt it was necessary. She also declined rectal exam today in the office. Once records have been reviewed which make further recommendations.  With regards to worsening renal function, she is to followup with Dr. Nevada Crane.

## 2013-09-19 NOTE — Progress Notes (Signed)
cc'd to pcp 

## 2013-10-03 NOTE — Telephone Encounter (Signed)
Pt called stating she did not have her TCS at [facility]. Per pt her husband told her she had it done at [facility] in [place]. I told pt I would mail her another sign of release form so I can get her records from [facility].

## 2013-10-03 NOTE — Telephone Encounter (Signed)
Marilyn Ortiz, what is the status of my request dated 09/18/13????

## 2013-10-06 NOTE — Telephone Encounter (Signed)
Pt has appt in August with Dr.Hall for labs and an appt to follow up on labs.

## 2013-10-06 NOTE — Telephone Encounter (Signed)
Did we have luck with getting her records.

## 2013-10-06 NOTE — Telephone Encounter (Signed)
Tried to call Dr.Hall's office, they are closed.

## 2013-10-09 NOTE — Telephone Encounter (Signed)
I am waiting on her to get the sign of release form back to me, she is aware of it. Once I get the Sign of Release form I can request her records

## 2013-10-10 NOTE — Telephone Encounter (Signed)
Pt came into the office today and filled out a sign of release form I requested records from [facility] in [place].

## 2013-10-18 NOTE — Telephone Encounter (Signed)
Records from [facility] are in [facility] cart.

## 2013-10-18 NOTE — Telephone Encounter (Signed)
I called Medical Records and spoke with Marilyn Ortiz she mailed records to our address and they were returned then she mailed them to our PO box. Per Marilyn Ortiz they were mailed and are on the way here.

## 2013-10-18 NOTE — Telephone Encounter (Signed)
See addendum to OV note. Notes from [facility], no procedure notes available.  Still waiting on records from [facility].

## 2013-10-18 NOTE — Progress Notes (Signed)
Per her PCP records at [facility] she had EGD 2010 and colonoscopy in 2011. I do not have official results. We are waiting on records.  Chelsey, please follow up on records from [facility].

## 2013-11-08 NOTE — Telephone Encounter (Signed)
We have not been successful with obtaining records after multiple attempts. At this point, we should consider repeating CBC along with iron/tibc, ferritin.    Arrange for OV with RMR only so we can continue work-up.

## 2013-11-14 ENCOUNTER — Encounter: Payer: Self-pay | Admitting: Internal Medicine

## 2013-11-14 ENCOUNTER — Other Ambulatory Visit: Payer: Self-pay | Admitting: Gastroenterology

## 2013-11-14 ENCOUNTER — Other Ambulatory Visit: Payer: Self-pay

## 2013-11-14 DIAGNOSIS — D649 Anemia, unspecified: Secondary | ICD-10-CM

## 2013-11-14 NOTE — Telephone Encounter (Signed)
Letter and lab order mailed to pt.  Marilyn Ortiz, please schedule ov.

## 2013-11-14 NOTE — Telephone Encounter (Signed)
Pt is aware of OV on 8/28 at 8 with RMR and appt card mailed

## 2013-12-06 ENCOUNTER — Ambulatory Visit (INDEPENDENT_AMBULATORY_CARE_PROVIDER_SITE_OTHER): Payer: BC Managed Care – PPO | Admitting: *Deleted

## 2013-12-06 DIAGNOSIS — I495 Sick sinus syndrome: Secondary | ICD-10-CM

## 2013-12-08 ENCOUNTER — Encounter: Payer: Self-pay | Admitting: Internal Medicine

## 2013-12-08 ENCOUNTER — Encounter (INDEPENDENT_AMBULATORY_CARE_PROVIDER_SITE_OTHER): Payer: Self-pay

## 2013-12-08 ENCOUNTER — Other Ambulatory Visit: Payer: Self-pay | Admitting: Internal Medicine

## 2013-12-08 ENCOUNTER — Ambulatory Visit (INDEPENDENT_AMBULATORY_CARE_PROVIDER_SITE_OTHER): Payer: BC Managed Care – PPO | Admitting: Internal Medicine

## 2013-12-08 VITALS — BP 110/60 | HR 80 | Temp 98.2°F | Ht 64.0 in | Wt 214.0 lb

## 2013-12-08 DIAGNOSIS — K921 Melena: Secondary | ICD-10-CM

## 2013-12-08 MED ORDER — PEG-KCL-NACL-NASULF-NA ASC-C 100 G PO SOLR: 1.0000 | ORAL | Status: DC

## 2013-12-08 NOTE — Progress Notes (Signed)
Primary Care Physician:  Delphina Cahill, MD Primary Gastroenterologist:  Dr. Gala Romney  Pre-Procedure History & Physical: HPI:  Marilyn Ortiz is a 53 y.o. female here for followup of anemia and hematochezia. Had painless hematochezia earlier in the year -  no subsequent episodes. In spite of multiple attempts, we've not been able to retrieve endoscopy reports from [facility]. We have not received the latest labs from Dr. Nevada Crane office as of yet.  Patient tells me she had a negative colonoscopy "probably 5-6 years ago" over and [employer]. No associated abdominal pain. Since she had her pacemaker placed, she really doesn't have any dysphagia or syncope. No reflux symptoms, abdominal pain, nausea or vomiting. Hasn't had any melena. Overall doing well from a GI standpoint these days. Denies any form of nonsteroidal agent.  Describes a grade 3 hemorrhoid on occasion.  Past Medical History  Diagnosis Date  . Deglutition syncope   . HTN (hypertension)   . Chronic headaches   . OSA (obstructive sleep apnea)   . Dilation of esophagus   . Hyperlipidemia   . Skin cancer (melanoma) ? 2002    Back    Past Surgical History  Procedure Laterality Date  . Pacemaker insertion      St. Jude Accent 2210  . Tubal ligation    . Knee surgery      right  . Esophageal dilation    . Cesarean section      x 2  . Lumbar disc surgery      L4  . Vesicovaginal fistula closure w/ tah    . Bladder suspension    . Abdominal hysterectomy    . Colonoscopy      normal per patient. Around 2010 in [place].    Prior to Admission medications   Medication Sig Start Date End Date Taking? Authorizing Provider  Doxylamine Succinate, Sleep, (UNISOM) 25 MG tablet Take 50 mg by mouth at bedtime as needed for sleep.    Yes Historical Provider, MD  lisinopril-hydrochlorothiazide (PRINZIDE,ZESTORETIC) 20-25 MG per tablet TAKE 1 TABLET BY MOUTH DAILY.   Yes Deboraha Sprang, MD  nebivolol (BYSTOLIC) 5 MG tablet Take 5 mg by  mouth daily.   Yes Historical Provider, MD  tiZANidine (ZANAFLEX) 4 MG tablet Take 4-8 mg by mouth at bedtime.   Yes Historical Provider, MD    Allergies as of 12/08/2013 - Review Complete 09/18/2013  Allergen Reaction Noted  . Codeine    . Morphine and related  07/22/2010  . Penicillins      Family History  Problem Relation Age of Onset  . Allergies Maternal Grandmother   . Heart disease      paternal side  . Rheum arthritis Maternal Grandfather   . Ovarian cancer Mother     stage IV, survival  . Colon cancer Other     paternal aunt, age late 56s/  . Lung cancer Neg Hx   . Breast cancer Mother     History   Social History  . Marital Status: Married    Spouse Name: [name]    Number of Children: 3  . Years of Education: N/A   Occupational History  . Childcare     Full-time   Social History Main Topics  . Smoking status: Never Smoker   . Smokeless tobacco: Never Used  . Alcohol Use: Yes     Comment: wine on occ  . Drug Use: No  . Sexual Activity: Not on file  Other Topics Concern  . Not on file   Social History Narrative  . No narrative on file    Review of Systems: See HPI, otherwise negative ROS  Physical Exam: BP 110/60  Pulse 80  Temp(Src) 98.2 F (36.8 C) (Oral)  Ht 5\' 4"  (1.626 m)  Wt 214 lb (97.07 kg)  BMI 36.72 kg/m2 General:   Alert,  Well-developed, well-nourished, pleasant and cooperative in NAD Skin:  Intact without significant lesions or rashes. Eyes:  Sclera clear, no icterus.   Conjunctiva pink. Ears:  Normal auditory acuity. Nose:  No deformity, discharge,  or lesions. Mouth:  No deformity or lesions. Neck:  Supple; no masses or thyromegaly. No significant cervical adenopathy. Lungs:  Clear throughout to auscultation.   No wheezes, crackles, or rhonchi. No acute distress. Heart:  Regular rate and rhythm; no murmurs, clicks, rubs,  or gallops. Abdomen: Obese. Non-distended, normal bowel sounds.  Soft and nontender without  appreciable mass or hepatosplenomegaly.  Pulses:  Normal pulses noted. Extremities:  Without clubbing or edema. Rectal: Declined until time of colonoscopy   Impression:   Pleasant 53 year old lady with hematochezia earlier this year. Mild anemia. Has been some time since she had a colonoscopy. I told this nice lady the best,  safest approach is to proceed with a diagnostic colonoscopy now. She is really doing well from an upper GI tract standpoint.  Recommendations:  Schedule a diagnostic colonoscopy  - hematochezia.  The risks, benefits, limitations, alternatives and imponderables have been reviewed with the patient. Questions have been answered. All parties are agreeable.   Split Movie prep  Addendum:  Just obtained records regarding the fifth of this month from Dr. Juel Burrow office. H&H 11.8 and 34.9 MCV 80.6. Platelet count 229,000. AST 14 ALT 9 bilirubin 0.5 alkaline phosphatase 65. obtain a copy for our records    Notice: This dictation was prepared with Dragon dictation along with smaller phrase technology. Any transcriptional errors that result from this process are unintentional and may not be corrected upon review.

## 2013-12-08 NOTE — Patient Instructions (Addendum)
Schedule a diagnostic colonoscopy  - hematochezia  Split Movie prep  Would like to get a copy of recent labs from Dr. Juel Burrow office - Ms. Digiulio to obtain a copy for our records

## 2013-12-08 NOTE — Progress Notes (Signed)
Remote pacemaker transmission.   

## 2013-12-11 ENCOUNTER — Encounter (HOSPITAL_COMMUNITY): Payer: Self-pay | Admitting: Pharmacy Technician

## 2013-12-11 ENCOUNTER — Other Ambulatory Visit: Payer: Self-pay

## 2013-12-11 LAB — MDC_IDC_ENUM_SESS_TYPE_REMOTE
Battery Remaining Longevity: 87 mo
Battery Remaining Percentage: 74 %
Battery Voltage: 2.95 V
Brady Statistic RA Percent Paced: 24 %
Brady Statistic RV Percent Paced: 1 %
Date Time Interrogation Session: 20150826064709
Lead Channel Impedance Value: 410 Ohm
Lead Channel Pacing Threshold Pulse Width: 0.4 ms
Lead Channel Pacing Threshold Pulse Width: 0.4 ms
Lead Channel Sensing Intrinsic Amplitude: 1.9 mV
Lead Channel Sensing Intrinsic Amplitude: 6.4 mV
Lead Channel Setting Pacing Amplitude: 2.5 V
Lead Channel Setting Pacing Pulse Width: 0.4 ms
MDC IDC MSMT LEADCHNL RA PACING THRESHOLD AMPLITUDE: 0.75 V
MDC IDC MSMT LEADCHNL RV IMPEDANCE VALUE: 410 Ohm
MDC IDC MSMT LEADCHNL RV PACING THRESHOLD AMPLITUDE: 1.25 V
MDC IDC PG SERIAL: 2150303
MDC IDC SET LEADCHNL RA PACING AMPLITUDE: 2 V
MDC IDC SET LEADCHNL RV SENSING SENSITIVITY: 0.5 mV
MDC IDC STAT BRADY AP VP PERCENT: 1 %
MDC IDC STAT BRADY AP VS PERCENT: 25 %
MDC IDC STAT BRADY AS VP PERCENT: 1 %
MDC IDC STAT BRADY AS VS PERCENT: 74 %

## 2013-12-21 ENCOUNTER — Encounter: Payer: Self-pay | Admitting: Cardiology

## 2013-12-27 ENCOUNTER — Encounter (HOSPITAL_COMMUNITY)
Admission: RE | Disposition: A | Discharge status: Home / Self Care | Payer: Self-pay | Source: Ambulatory Visit | Attending: Internal Medicine

## 2013-12-27 ENCOUNTER — Ambulatory Visit (HOSPITAL_COMMUNITY)
Admission: RE | Admit: 2013-12-27 | Discharge: 2013-12-27 | Disposition: A | Discharge status: Home / Self Care | Payer: BC Managed Care – PPO | Source: Ambulatory Visit | Attending: Internal Medicine | Admitting: Internal Medicine

## 2013-12-27 ENCOUNTER — Encounter (HOSPITAL_COMMUNITY): Payer: Self-pay | Admitting: *Deleted

## 2013-12-27 DIAGNOSIS — Z85820 Personal history of malignant melanoma of skin: Secondary | ICD-10-CM | POA: Diagnosis not present

## 2013-12-27 DIAGNOSIS — E785 Hyperlipidemia, unspecified: Secondary | ICD-10-CM | POA: Insufficient documentation

## 2013-12-27 DIAGNOSIS — D126 Benign neoplasm of colon, unspecified: Secondary | ICD-10-CM | POA: Insufficient documentation

## 2013-12-27 DIAGNOSIS — Z86010 Personal history of colonic polyps: Secondary | ICD-10-CM

## 2013-12-27 DIAGNOSIS — Z90710 Acquired absence of both cervix and uterus: Secondary | ICD-10-CM | POA: Insufficient documentation

## 2013-12-27 DIAGNOSIS — I10 Essential (primary) hypertension: Secondary | ICD-10-CM | POA: Diagnosis not present

## 2013-12-27 DIAGNOSIS — K641 Second degree hemorrhoids: Secondary | ICD-10-CM

## 2013-12-27 DIAGNOSIS — K921 Melena: Secondary | ICD-10-CM

## 2013-12-27 DIAGNOSIS — Z950 Presence of cardiac pacemaker: Secondary | ICD-10-CM | POA: Diagnosis not present

## 2013-12-27 DIAGNOSIS — K649 Unspecified hemorrhoids: Secondary | ICD-10-CM

## 2013-12-27 DIAGNOSIS — D509 Iron deficiency anemia, unspecified: Secondary | ICD-10-CM | POA: Diagnosis not present

## 2013-12-27 DIAGNOSIS — K648 Other hemorrhoids: Secondary | ICD-10-CM | POA: Insufficient documentation

## 2013-12-27 DIAGNOSIS — G4733 Obstructive sleep apnea (adult) (pediatric): Secondary | ICD-10-CM | POA: Diagnosis not present

## 2013-12-27 HISTORY — PX: COLONOSCOPY: SHX5424

## 2013-12-27 LAB — CBC
HEMATOCRIT: 36 % (ref 36.0–46.0)
Hemoglobin: 11.6 g/dL — ABNORMAL LOW (ref 12.0–15.0)
MCH: 27.2 pg (ref 26.0–34.0)
MCHC: 32.2 g/dL (ref 30.0–36.0)
MCV: 84.3 fL (ref 78.0–100.0)
Platelets: 208 10*3/uL (ref 150–400)
RBC: 4.27 MIL/uL (ref 3.87–5.11)
RDW: 13.9 % (ref 11.5–15.5)
WBC: 5.4 10*3/uL (ref 4.0–10.5)

## 2013-12-27 LAB — IRON AND TIBC
Iron: 29 ug/dL — ABNORMAL LOW (ref 42–135)
Saturation Ratios: 10 % — ABNORMAL LOW (ref 20–55)
TIBC: 298 ug/dL (ref 250–470)
UIBC: 269 ug/dL (ref 125–400)

## 2013-12-27 SURGERY — COLONOSCOPY
Anesthesia: Moderate Sedation

## 2013-12-27 MED ORDER — MIDAZOLAM HCL 5 MG/5ML IJ SOLN
INTRAMUSCULAR | Status: AC
  Filled 2013-12-27: qty 10

## 2013-12-27 MED ORDER — SIMETHICONE 40 MG/0.6ML PO SUSP
ORAL | Status: DC | PRN
  Administered 2013-12-27: 13:00:00

## 2013-12-27 MED ORDER — ONDANSETRON HCL 4 MG/2ML IJ SOLN
INTRAMUSCULAR | Status: DC | PRN
  Administered 2013-12-27: 4 mg via INTRAVENOUS

## 2013-12-27 MED ORDER — SODIUM CHLORIDE 0.9 % IV SOLN
INTRAVENOUS | Status: DC
  Administered 2013-12-27: 12:00:00 via INTRAVENOUS

## 2013-12-27 MED ORDER — MIDAZOLAM HCL 5 MG/5ML IJ SOLN
INTRAMUSCULAR | Status: DC | PRN
Start: 2013-12-27 — End: 2013-12-27
  Administered 2013-12-27 (×2): 1 mg via INTRAVENOUS
  Administered 2013-12-27: 2 mg via INTRAVENOUS
  Administered 2013-12-27: 1 mg via INTRAVENOUS

## 2013-12-27 MED ORDER — MEPERIDINE HCL 100 MG/ML IJ SOLN
INTRAMUSCULAR | Status: DC | PRN
  Administered 2013-12-27: 25 mg via INTRAVENOUS
  Administered 2013-12-27: 50 mg via INTRAVENOUS

## 2013-12-27 MED ORDER — MEPERIDINE HCL 100 MG/ML IJ SOLN
INTRAMUSCULAR | Status: AC
  Filled 2013-12-27: qty 2

## 2013-12-27 MED ORDER — ONDANSETRON HCL 4 MG/2ML IJ SOLN
INTRAMUSCULAR | Status: AC
  Filled 2013-12-27: qty 2

## 2013-12-27 NOTE — Op Note (Signed)
[facility] [address] [place], [address]   COLONOSCOPY PROCEDURE REPORT  PATIENT: Marilyn Ortiz, Marilyn Ortiz  MR#:         [id number] BIRTHDATE: [birth year] , 6  yrs. old GENDER: Female ENDOSCOPIST: R.  Garfield Cornea, MD FACP FACG REFERRED BY:  Delphina Cahill, M.D. PROCEDURE DATE:  12/27/2013 PROCEDURE:     Ileocolonoscopy with biopsy  INDICATIONS: hematochezia; microcytic anemia  INFORMED CONSENT:  The risks, benefits, alternatives and imponderables including but not limited to bleeding, perforation as well as the possibility of a missed lesion have been reviewed.  The potential for biopsy, lesion removal, etc. have also been discussed.  Questions have been answered.  All parties agreeable. Please see the history and physical in the medical record for more information.  MEDICATIONS: Versed 5 mg IV and Demerol 75 mg IV in divided doses. Zofran 4 mg IV.  DESCRIPTION OF PROCEDURE:  After a digital rectal exam was performed, the EC-[id number] ([id number])  colonoscope was advanced from the anus through the rectum and colon to the area of the cecum, ileocecal valve and appendiceal orifice.  The cecum was deeply intubated.  These structures were well-seen and photographed for the record.  From the level of the cecum and ileocecal valve, the scope was slowly and cautiously withdrawn.  The mucosal surfaces were carefully surveyed utilizing scope tip deflection to facilitate fold flattening as needed.  The scope was pulled down into the rectum where a thorough examination  was performed.    FINDINGS:  Adequate preparation. Anal canal hemorrhoids. Otherwise, normal-appearing rectal mucosa. Rectum small; unable to retroflex -  the rectum was seen well on face. (1) diminutive polyp at the rectosigmoid junction; otherwise, Normal-appearing colonic mucosa aside from a single innocent-appearing 6 mm AVM in the base the cecum. The distal 10 cm of terminal ileum mucosa appeared  normal.  THERAPEUTIC / DIAGNOSTIC MANEUVERS PERFORMED:  The above-mentioned polyp was cold biopsied/removed.  COMPLICATIONS: none  CECAL WITHDRAWAL TIME:  10 minutes  IMPRESSION:  Anal canal hemorrhoids-likely source of hematochezia. Single rectosigmoid polyp  -  status post removal as described above. 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  RECOMMENDATIONS:   Course of Anusol suppositories;  Followup on pathology. Office visit in 6-8 weeks. May be a good hemorrhoid banding candidate.   _______________________________ eSigned:  R. Garfield Cornea, MD FACP [facility] 12/27/2013 1:49 PM   CC:

## 2013-12-27 NOTE — Interval H&P Note (Signed)
History and Physical Interval Note:  12/27/2013 1:08 PM  Marilyn Ortiz  has presented today for surgery, with the diagnosis of HEMATOCHEZIA  The various methods of treatment have been discussed with the patient and family. After consideration of risks, benefits and other options for treatment, the patient has consented to  Procedure(s) with comments: COLONOSCOPY (N/A) - 1:00 - rescheduled to 9/16 @1 :00 Ginger to notify pt as a surgical intervention .  The patient's history has been reviewed, patient examined, no change in status, stable for surgery.  I have reviewed the patient's chart and labs.  Questions were answered to the patient's satisfaction.     Ghassan Coggeshall  No change. Colonoscopy per plan.The risks, benefits, limitations, alternatives and imponderables have been reviewed with the patient. Questions have been answered. All parties are agreeable.

## 2013-12-27 NOTE — H&P (View-Only) (Signed)
Primary Care Physician:  Delphina Cahill, MD Primary Gastroenterologist:  Dr. Gala Romney  Pre-Procedure History & Physical: HPI:  Marilyn Ortiz is a 54 y.o. female here for followup of anemia and hematochezia. Had painless hematochezia earlier in the year -  no subsequent episodes. In spite of multiple attempts, we've not been able to retrieve endoscopy reports from [facility]. We have not received the latest labs from Dr. Nevada Crane office as of yet.  Patient tells me she had a negative colonoscopy "probably 5-6 years ago" over and [employer]. No associated abdominal pain. Since she had her pacemaker placed, she really doesn't have any dysphagia or syncope. No reflux symptoms, abdominal pain, nausea or vomiting. Hasn't had any melena. Overall doing well from a GI standpoint these days. Denies any form of nonsteroidal agent.  Describes a grade 3 hemorrhoid on occasion.  Past Medical History  Diagnosis Date  . Deglutition syncope   . HTN (hypertension)   . Chronic headaches   . OSA (obstructive sleep apnea)   . Dilation of esophagus   . Hyperlipidemia   . Skin cancer (melanoma) ? 2002    Back    Past Surgical History  Procedure Laterality Date  . Pacemaker insertion      St. Jude Accent 2210  . Tubal ligation    . Knee surgery      right  . Esophageal dilation    . Cesarean section      x 2  . Lumbar disc surgery      L4  . Vesicovaginal fistula closure w/ tah    . Bladder suspension    . Abdominal hysterectomy    . Colonoscopy      normal per patient. Around 2010 in [place].    Prior to Admission medications   Medication Sig Start Date End Date Taking? Authorizing Provider  Doxylamine Succinate, Sleep, (UNISOM) 25 MG tablet Take 50 mg by mouth at bedtime as needed for sleep.    Yes Historical Provider, MD  lisinopril-hydrochlorothiazide (PRINZIDE,ZESTORETIC) 20-25 MG per tablet TAKE 1 TABLET BY MOUTH DAILY.   Yes Deboraha Sprang, MD  nebivolol (BYSTOLIC) 5 MG tablet Take 5 mg by  mouth daily.   Yes Historical Provider, MD  tiZANidine (ZANAFLEX) 4 MG tablet Take 4-8 mg by mouth at bedtime.   Yes Historical Provider, MD    Allergies as of 12/08/2013 - Review Complete 09/18/2013  Allergen Reaction Noted  . Codeine    . Morphine and related  07/22/2010  . Penicillins      Family History  Problem Relation Age of Onset  . Allergies Maternal Grandmother   . Heart disease      paternal side  . Rheum arthritis Maternal Grandfather   . Ovarian cancer Mother     stage IV, survival  . Colon cancer Other     paternal aunt, age late 69s/  . Lung cancer Neg Hx   . Breast cancer Mother     History   Social History  . Marital Status: Married    Spouse Name: Tawonna Esquer    Number of Children: 3  . Years of Education: N/A   Occupational History  . Childcare     Full-time   Social History Main Topics  . Smoking status: Never Smoker   . Smokeless tobacco: Never Used  . Alcohol Use: Yes     Comment: wine on occ  . Drug Use: No  . Sexual Activity: Not on file  Other Topics Concern  . Not on file   Social History Narrative  . No narrative on file    Review of Systems: See HPI, otherwise negative ROS  Physical Exam: BP 110/60  Pulse 80  Temp(Src) 98.2 F (36.8 C) (Oral)  Ht 5\' 4"  (1.626 m)  Wt 214 lb (97.07 kg)  BMI 36.72 kg/m2 General:   Alert,  Well-developed, well-nourished, pleasant and cooperative in NAD Skin:  Intact without significant lesions or rashes. Eyes:  Sclera clear, no icterus.   Conjunctiva pink. Ears:  Normal auditory acuity. Nose:  No deformity, discharge,  or lesions. Mouth:  No deformity or lesions. Neck:  Supple; no masses or thyromegaly. No significant cervical adenopathy. Lungs:  Clear throughout to auscultation.   No wheezes, crackles, or rhonchi. No acute distress. Heart:  Regular rate and rhythm; no murmurs, clicks, rubs,  or gallops. Abdomen: Obese. Non-distended, normal bowel sounds.  Soft and nontender without  appreciable mass or hepatosplenomegaly.  Pulses:  Normal pulses noted. Extremities:  Without clubbing or edema. Rectal: Declined until time of colonoscopy   Impression:   Pleasant 53 year old lady with hematochezia earlier this year. Mild anemia. Has been some time since she had a colonoscopy. I told this nice lady the best,  safest approach is to proceed with a diagnostic colonoscopy now. She is really doing well from an upper GI tract standpoint.  Recommendations:  Schedule a diagnostic colonoscopy  - hematochezia.  The risks, benefits, limitations, alternatives and imponderables have been reviewed with the patient. Questions have been answered. All parties are agreeable.   Split Movie prep  Addendum:  Just obtained records regarding the fifth of this month from Dr. Juel Burrow office. H&H 11.8 and 34.9 MCV 80.6. Platelet count 229,000. AST 14 ALT 9 bilirubin 0.5 alkaline phosphatase 65. obtain a copy for our records    Notice: This dictation was prepared with Dragon dictation along with smaller phrase technology. Any transcriptional errors that result from this process are unintentional and may not be corrected upon review.

## 2013-12-27 NOTE — Discharge Instructions (Signed)
Colonoscopy Discharge Instructions  Read the instructions outlined below and refer to this sheet in the next few weeks. These discharge instructions provide you with general information on caring for yourself after you leave the hospital. Your doctor may also give you specific instructions. While your treatment has been planned according to the most current medical practices available, unavoidable complications occasionally occur. If you have any problems or questions after discharge, call Dr. Gala Romney at [phone]. ACTIVITY  You may resume your regular activity, but move at a slower pace for the next 24 hours.   Take frequent rest periods for the next 24 hours.   Walking will help get rid of the air and reduce the bloated feeling in your belly (abdomen).   No driving for 24 hours (because of the medicine (anesthesia) used during the test).    Do not sign any important legal documents or operate any machinery for 24 hours (because of the anesthesia used during the test).  NUTRITION  Drink plenty of fluids.   You may resume your normal diet as instructed by your doctor.   Begin with a light meal and progress to your normal diet. Heavy or fried foods are harder to digest and may make you feel sick to your stomach (nauseated).   Avoid alcoholic beverages for 24 hours or as instructed.  MEDICATIONS  You may resume your normal medications unless your doctor tells you otherwise.  WHAT YOU CAN EXPECT TODAY  Some feelings of bloating in the abdomen.   Passage of more gas than usual.   Spotting of blood in your stool or on the toilet paper.  IF YOU HAD POLYPS REMOVED DURING THE COLONOSCOPY:  No aspirin products for 7 days or as instructed.   No alcohol for 7 days or as instructed.   Eat a soft diet for the next 24 hours.  FINDING OUT THE RESULTS OF YOUR TEST Not all test results are available during your visit. If your test results are not back during the visit, make an appointment  with your caregiver to find out the results. Do not assume everything is normal if you have not heard from your caregiver or the medical facility. It is important for you to follow up on all of your test results.  SEEK IMMEDIATE MEDICAL ATTENTION IF:  You have more than a spotting of blood in your stool.   Your belly is swollen (abdominal distention).   You are nauseated or vomiting.   You have a temperature over 101.   You have abdominal pain or discomfort that is severe or gets worse throughout the day.   Polyp and hemorrhoid information provided  Ten-day course of Anusol suppositories one per rectum at bedtime  Further recommendations to follow pending review of pathology report  Colon Polyps Polyps are lumps of extra tissue growing inside the body. Polyps can grow in the large intestine (colon). Most colon polyps are noncancerous (benign). However, some colon polyps can become cancerous over time. Polyps that are larger than a pea may be harmful. To be safe, caregivers remove and test all polyps. CAUSES  Polyps form when mutations in the genes cause your cells to grow and divide even though no more tissue is needed. RISK FACTORS There are a number of risk factors that can increase your chances of getting colon polyps. They include:  Being older than 50 years.  Family history of colon polyps or colon cancer.  Long-term colon diseases, such as colitis or Crohn disease.  Being  overweight.  Smoking.  Being inactive.  Drinking too much alcohol. SYMPTOMS  Most small polyps do not cause symptoms. If symptoms are present, they may include:  Blood in the stool. The stool may look dark red or black.  Constipation or diarrhea that lasts longer than 1 week. DIAGNOSIS People often do not know they have polyps until their caregiver finds them during a regular checkup. Your caregiver can use 4 tests to check for polyps:  Digital rectal exam. The caregiver wears gloves and feels  inside the rectum. This test would find polyps only in the rectum.  Barium enema. The caregiver puts a liquid called barium into your rectum before taking X-rays of your colon. Barium makes your colon look white. Polyps are dark, so they are easy to see in the X-ray pictures.  Sigmoidoscopy. A thin, flexible tube (sigmoidoscope) is placed into your rectum. The sigmoidoscope has a light and tiny camera in it. The caregiver uses the sigmoidoscope to look at the last third of your colon.  Colonoscopy. This test is like sigmoidoscopy, but the caregiver looks at the entire colon. This is the most common method for finding and removing polyps. TREATMENT  Any polyps will be removed during a sigmoidoscopy or colonoscopy. The polyps are then tested for cancer. PREVENTION  To help lower your risk of getting more colon polyps:  Eat plenty of fruits and vegetables. Avoid eating fatty foods.  Do not smoke.  Avoid drinking alcohol.  Exercise every day.  Lose weight if recommended by your caregiver.  Eat plenty of calcium and folate. Foods that are rich in calcium include milk, cheese, and broccoli. Foods that are rich in folate include chickpeas, kidney beans, and spinach. HOME CARE INSTRUCTIONS Keep all follow-up appointments as directed by your caregiver. You may need periodic exams to check for polyps. SEEK MEDICAL CARE IF: You notice bleeding during a bowel movement. Document Released: 12/25/2003 Document Revised: 06/22/2011 Document Reviewed: 06/09/2011 [facility] Patient Information 2015 [place], [place]. This information is not intended to replace advice given to you by your health care provider. Make sure you discuss any questions you have with your health care provider.  Hemorrhoids Hemorrhoids are swollen veins around the rectum or anus. There are two types of hemorrhoids:   Internal hemorrhoids. These occur in the veins just inside the rectum. They may poke through to the outside and  become irritated and painful.  External hemorrhoids. These occur in the veins outside the anus and can be felt as a painful swelling or hard lump near the anus. CAUSES  Pregnancy.   Obesity.   Constipation or diarrhea.   Straining to have a bowel movement.   Sitting for long periods on the toilet.  Heavy lifting or other activity that caused you to strain.  Anal intercourse. SYMPTOMS   Pain.   Anal itching or irritation.   Rectal bleeding.   Fecal leakage.   Anal swelling.   One or more lumps around the anus.  DIAGNOSIS  Your caregiver may be able to diagnose hemorrhoids by visual examination. Other examinations or tests that may be performed include:   Examination of the rectal area with a gloved hand (digital rectal exam).   Examination of anal canal using a small tube (scope).   A blood test if you have lost a significant amount of blood.  A test to look inside the colon (sigmoidoscopy or colonoscopy). TREATMENT Most hemorrhoids can be treated at home. However, if symptoms do not seem to be getting  better or if you have a lot of rectal bleeding, your caregiver may perform a procedure to help make the hemorrhoids get smaller or remove them completely. Possible treatments include:   Placing a rubber band at the base of the hemorrhoid to cut off the circulation (rubber band ligation).   Injecting a chemical to shrink the hemorrhoid (sclerotherapy).   Using a tool to burn the hemorrhoid (infrared light therapy).   Surgically removing the hemorrhoid (hemorrhoidectomy).   Stapling the hemorrhoid to block blood flow to the tissue (hemorrhoid stapling).  HOME CARE INSTRUCTIONS   Eat foods with fiber, such as whole grains, beans, nuts, fruits, and vegetables. Ask your doctor about taking products with added fiber in them (fibersupplements).  Increase fluid intake. Drink enough water and fluids to keep your urine clear or pale yellow.   Exercise  regularly.   Go to the bathroom when you have the urge to have a bowel movement. Do not wait.   Avoid straining to have bowel movements.   Keep the anal area dry and clean. Use wet toilet paper or moist towelettes after a bowel movement.   Medicated creams and suppositories may be used or applied as directed.   Only take over-the-counter or prescription medicines as directed by your caregiver.   Take warm sitz baths for 15-20 minutes, 3-4 times a day to ease pain and discomfort.   Place ice packs on the hemorrhoids if they are tender and swollen. Using ice packs between sitz baths may be helpful.   Put ice in a plastic bag.   Place a towel between your skin and the bag.   Leave the ice on for 15-20 minutes, 3-4 times a day.   Do not use a donut-shaped pillow or sit on the toilet for long periods. This increases blood pooling and pain.  SEEK MEDICAL CARE IF:  You have increasing pain and swelling that is not controlled by treatment or medicine.  You have uncontrolled bleeding.  You have difficulty or you are unable to have a bowel movement.  You have pain or inflammation outside the area of the hemorrhoids. MAKE SURE YOU:  Understand these instructions.  Will watch your condition.  Will get help right away if you are not doing well or get worse. Document Released: 03/27/2000 Document Revised: 03/16/2012 Document Reviewed: 02/02/2012 [facility] Patient Information 2015 [place], [place]. This information is not intended to replace advice given to you by your health care provider. Make sure you discuss any questions you have with your health care provider.

## 2013-12-28 ENCOUNTER — Encounter: Payer: Self-pay | Admitting: Internal Medicine

## 2013-12-28 LAB — FERRITIN: Ferritin: 172 ng/mL (ref 10–291)

## 2013-12-29 ENCOUNTER — Encounter (HOSPITAL_COMMUNITY): Payer: Self-pay | Admitting: Internal Medicine

## 2013-12-30 ENCOUNTER — Encounter: Payer: Self-pay | Admitting: Internal Medicine

## 2014-01-01 ENCOUNTER — Telehealth: Payer: Self-pay

## 2014-01-01 NOTE — Telephone Encounter (Signed)
Letter mailed to pt. Lab order on file Erline Levine, please schedule hemorrhoid banding. Manuela Schwartz, please cc pcp

## 2014-01-01 NOTE — Telephone Encounter (Signed)
Letter from: Daneil Dolin  Send letter to patient.  Send copy of letter with path to referring provider and PCP.   Needs CBC in 2 months ; Would offer banding by me in 3-4 weeks

## 2014-01-02 ENCOUNTER — Encounter: Payer: Self-pay | Admitting: Internal Medicine

## 2014-01-02 NOTE — Telephone Encounter (Signed)
APPT MADE AND LETTER SENT  °

## 2014-01-09 NOTE — Telephone Encounter (Signed)
Faxed to pcp °

## 2014-01-11 ENCOUNTER — Other Ambulatory Visit: Payer: Self-pay | Admitting: Internal Medicine

## 2014-01-11 DIAGNOSIS — D649 Anemia, unspecified: Secondary | ICD-10-CM

## 2014-01-23 ENCOUNTER — Encounter: Payer: BC Managed Care – PPO | Admitting: Internal Medicine

## 2014-01-23 ENCOUNTER — Encounter: Payer: Self-pay | Admitting: Internal Medicine

## 2014-01-24 ENCOUNTER — Other Ambulatory Visit: Payer: Self-pay

## 2014-01-24 DIAGNOSIS — D649 Anemia, unspecified: Secondary | ICD-10-CM

## 2014-01-29 ENCOUNTER — Telehealth: Payer: Self-pay | Admitting: Internal Medicine

## 2014-01-29 NOTE — Telephone Encounter (Signed)
PATIENT ON NOV RECALL LIST FOR LABS

## 2014-01-29 NOTE — Telephone Encounter (Signed)
Letter and lab order has been mailed to the pt.

## 2014-02-23 LAB — CBC WITH DIFFERENTIAL/PLATELET
Basophils Absolute: 0 10*3/uL (ref 0.0–0.1)
Basophils Relative: 0 % (ref 0–1)
Eosinophils Absolute: 0 10*3/uL (ref 0.0–0.7)
Eosinophils Relative: 0 % (ref 0–5)
HCT: 34.7 % — ABNORMAL LOW (ref 36.0–46.0)
HEMOGLOBIN: 11.5 g/dL — AB (ref 12.0–15.0)
LYMPHS ABS: 1.9 10*3/uL (ref 0.7–4.0)
Lymphocytes Relative: 18 % (ref 12–46)
MCH: 26.3 pg (ref 26.0–34.0)
MCHC: 33.1 g/dL (ref 30.0–36.0)
MCV: 79.2 fL (ref 78.0–100.0)
MONOS PCT: 6 % (ref 3–12)
Monocytes Absolute: 0.6 10*3/uL (ref 0.1–1.0)
NEUTROS ABS: 7.8 10*3/uL — AB (ref 1.7–7.7)
Neutrophils Relative %: 76 % (ref 43–77)
Platelets: 303 10*3/uL (ref 150–400)
RBC: 4.38 MIL/uL (ref 3.87–5.11)
RDW: 15.5 % (ref 11.5–15.5)
WBC: 10.3 10*3/uL (ref 4.0–10.5)

## 2014-02-28 ENCOUNTER — Ambulatory Visit (INDEPENDENT_AMBULATORY_CARE_PROVIDER_SITE_OTHER): Payer: BC Managed Care – PPO | Admitting: Internal Medicine

## 2014-02-28 ENCOUNTER — Encounter: Payer: Self-pay | Admitting: Internal Medicine

## 2014-02-28 VITALS — BP 152/96 | HR 82 | Temp 97.3°F | Ht 64.0 in | Wt 212.8 lb

## 2014-02-28 DIAGNOSIS — D509 Iron deficiency anemia, unspecified: Secondary | ICD-10-CM

## 2014-02-28 NOTE — Progress Notes (Signed)
,    Primary Care Physician:  HALL, ZACH, MD Primary Gastroenterologist:  Dr. Latisha Lasch  Pre-Procedure History & Physical: HPI:  Marilyn Ortiz is a 53 y.o. female here for followup of rectal bleeding. His microcytic anemia. Recent colonoscopy demonstrated internal canal hemorrhoids and a hyperplastic polyp. 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 Ferritin normal at 172 her iron level is low at his is here TIBC. Had paper hematochezia but resolved with a Anusol suppositories. Rare occasional reflux symptoms. Currently, no GI symptoms. EGD 5 years ago in [place]. We have yet to receive those records. She was told she had an ulcer. Aside from rare occasional reflux symptoms she really does not have any GI symptoms at this time. She is status post hysterectomy. Weight down about 6 pounds since her last visit. Blood pressure elevated today per  Past Medical History  Diagnosis Date  . Deglutition syncope   . HTN (hypertension)   . Chronic headaches   . OSA (obstructive sleep apnea)   . Dilation of esophagus   . Hyperlipidemia   . Skin cancer (melanoma) ? 2002    Back  . Hyperplastic colon polyp   . Hemorrhoids     Past Surgical History  Procedure Laterality Date  . Pacemaker insertion      St. Jude Accent 2210  . Tubal ligation    . Knee surgery      right  . Esophageal dilation    . Cesarean section      x 2  . Lumbar disc surgery      L4  . Vesicovaginal fistula closure w/ tah    . Bladder suspension    . Abdominal hysterectomy    . Colonoscopy      normal per patient. Around 2010 in [place].  . Colonoscopy N/A 12/27/2013    Dr.Phyllip Claw- anal canal hemorrhoids- single rectosigmoid polyp. 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 bx= hyperplastic polyp.    Prior to Admission medications   Medication Sig Start Date End Date Taking? Authorizing Provider  Doxylamine Succinate, Sleep, (UNISOM) 25 MG tablet Take 50 mg by mouth at bedtime as needed for sleep.    Yes Historical  Provider, MD  nebivolol (BYSTOLIC) 5 MG tablet Take 5 mg by mouth daily.   Yes Historical Provider, MD  NON FORMULARY Vitamins for Hair and Nails/ 2 tablets daily   Yes Historical Provider, MD  tiZANidine (ZANAFLEX) 4 MG tablet Take 4-8 mg by mouth at bedtime. As needed   Yes Historical Provider, MD  peg 3350 powder (MOVIPREP) 100 G SOLR Take 1 kit (200 g total) by mouth as directed. 12/08/13   Cecelia Graciano M Quandra Fedorchak, MD    Allergies as of 02/28/2014 - Review Complete 02/28/2014  Allergen Reaction Noted  . Codeine Nausea And Vomiting and Rash   . Morphine and related Nausea And Vomiting and Rash 07/22/2010  . Penicillins Rash     Family History  Problem Relation Age of Onset  . Allergies Maternal Grandmother   . Heart disease      paternal side  . Rheum arthritis Maternal Grandfather   . Ovarian cancer Mother     stage IV, survival  . Colon cancer Other     paternal aunt, age late 60s/  . Lung cancer Neg Hx   . Breast cancer Mother     History   Social History  . Marital Status: Married    Spouse Name: Ronnie Madariaga    Number of   Children: 3  . Years of Education: N/A   Occupational History  . Childcare     Full-time   Social History Main Topics  . Smoking status: Never Smoker   . Smokeless tobacco: Never Used     Comment: Never smoked  . Alcohol Use: 0.0 oz/week    0 Not specified per week     Comment: wine on occ  . Drug Use: No  . Sexual Activity: Not on file   Other Topics Concern  . Not on file   Social History Narrative    Review of Systems: See HPI, otherwise negative ROS  Physical Exam: BP 161/99 mmHg  Pulse 82  Temp(Src) 97.3 F (36.3 C) (Oral)  Ht 5' 4" (1.626 m)  Wt 212 lb 12.8 oz (96.525 kg)  BMI 36.51 kg/m2 General:   Alert,  Well-developed, well-nourished, pleasant and cooperative in NAD Skin:  Intact without significant lesions or rashes. Eyes:  Sclera clear, no icterus.   Conjunctiva pink. Ears:  Normal auditory acuity. Nose:  No deformity,  discharge,  or lesions. Mouth:  No deformity or lesions. Neck:  Supple; no masses or thyromegaly. No significant cervical adenopathy. Lungs:  Clear throughout to auscultation.   No wheezes, crackles, or rhonchi. No acute distress. Heart:  Regular rate and rhythm; no murmurs, clicks, rubs,  or gallops. Abdomen: Non-distended, normal bowel sounds.  Soft and nontender without appreciable mass or hepatosplenomegaly.  Pulses:  Normal pulses noted. Extremities:  Without clubbing or edema.  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 Ferritin well within normal limits. Hematochezia resolved. No need for hemorrhoid banding at this time. I continue to be interested in getting more information about the "stomach ulcer" she was told she had 5 years ago.  Recommendations:  Stool for occult blood testing today.  Patient to retrieve EGD report.  Further recommendations to follow.    Notice: This dictation was prepared with Dragon dictation along with smaller phrase technology. Any transcriptional errors that result from this process are unintentional and may not be corrected upon review. 

## 2014-02-28 NOTE — Patient Instructions (Signed)
Stool sample for occult blood testing  patient to retrieve EGD report from [place]   Further recommendations to follow

## 2014-03-01 ENCOUNTER — Ambulatory Visit (INDEPENDENT_AMBULATORY_CARE_PROVIDER_SITE_OTHER): Payer: BC Managed Care – PPO | Admitting: *Deleted

## 2014-03-01 DIAGNOSIS — Z1211 Encounter for screening for malignant neoplasm of colon: Secondary | ICD-10-CM

## 2014-03-01 LAB — IFOBT (OCCULT BLOOD): IMMUNOLOGICAL FECAL OCCULT BLOOD TEST: NEGATIVE

## 2014-03-01 NOTE — Progress Notes (Signed)
Patient ID: Marilyn Ortiz, female   DOB: 1960-04-21, 53 y.o.   MRN: [id number] IFOBT TEST: (-) NEGATIVE

## 2014-03-05 ENCOUNTER — Other Ambulatory Visit: Payer: Self-pay

## 2014-03-05 DIAGNOSIS — D649 Anemia, unspecified: Secondary | ICD-10-CM

## 2014-03-05 NOTE — Progress Notes (Signed)
EGD has been scheduled for 03/20/2014 @ 1245pm. Called pt and left message on machine regarding procedure. Reminder letter has been mailed.

## 2014-03-12 ENCOUNTER — Ambulatory Visit (INDEPENDENT_AMBULATORY_CARE_PROVIDER_SITE_OTHER): Payer: BC Managed Care – PPO | Admitting: *Deleted

## 2014-03-12 DIAGNOSIS — I495 Sick sinus syndrome: Secondary | ICD-10-CM

## 2014-03-12 LAB — MDC_IDC_ENUM_SESS_TYPE_REMOTE
Battery Remaining Longevity: 78 mo
Battery Remaining Percentage: 67 %
Battery Voltage: 2.93 V
Brady Statistic AP VP Percent: 1 %
Brady Statistic AS VS Percent: 73 %
Brady Statistic RA Percent Paced: 25 %
Brady Statistic RV Percent Paced: 1 %
Implantable Pulse Generator Model: 2210
Implantable Pulse Generator Serial Number: 2150303
Lead Channel Impedance Value: 390 Ohm
Lead Channel Pacing Threshold Amplitude: 0.75 V
Lead Channel Pacing Threshold Pulse Width: 0.4 ms
Lead Channel Sensing Intrinsic Amplitude: 2 mV
Lead Channel Setting Pacing Amplitude: 2 V
MDC IDC MSMT LEADCHNL RV IMPEDANCE VALUE: 410 Ohm
MDC IDC MSMT LEADCHNL RV PACING THRESHOLD AMPLITUDE: 1.25 V
MDC IDC MSMT LEADCHNL RV PACING THRESHOLD PULSEWIDTH: 0.4 ms
MDC IDC MSMT LEADCHNL RV SENSING INTR AMPL: 7 mV
MDC IDC SESS DTM: 20151130072423
MDC IDC SET LEADCHNL RV PACING AMPLITUDE: 2.5 V
MDC IDC SET LEADCHNL RV PACING PULSEWIDTH: 0.4 ms
MDC IDC SET LEADCHNL RV SENSING SENSITIVITY: 0.5 mV
MDC IDC STAT BRADY AP VS PERCENT: 26 %
MDC IDC STAT BRADY AS VP PERCENT: 1 %

## 2014-03-13 NOTE — Progress Notes (Signed)
Remote pacemaker transmission.   

## 2014-03-20 ENCOUNTER — Encounter (HOSPITAL_COMMUNITY): Payer: Self-pay | Admitting: *Deleted

## 2014-03-20 ENCOUNTER — Ambulatory Visit (HOSPITAL_COMMUNITY)
Admission: RE | Admit: 2014-03-20 | Discharge: 2014-03-20 | Disposition: A | Discharge status: Home / Self Care | Payer: BC Managed Care – PPO | Source: Ambulatory Visit | Attending: Internal Medicine | Admitting: Internal Medicine

## 2014-03-20 ENCOUNTER — Encounter (HOSPITAL_COMMUNITY)
Admission: RE | Disposition: A | Discharge status: Home / Self Care | Payer: Self-pay | Source: Ambulatory Visit | Attending: Internal Medicine

## 2014-03-20 DIAGNOSIS — E785 Hyperlipidemia, unspecified: Secondary | ICD-10-CM | POA: Insufficient documentation

## 2014-03-20 DIAGNOSIS — D509 Iron deficiency anemia, unspecified: Secondary | ICD-10-CM | POA: Insufficient documentation

## 2014-03-20 DIAGNOSIS — K222 Esophageal obstruction: Secondary | ICD-10-CM | POA: Diagnosis not present

## 2014-03-20 DIAGNOSIS — Z85828 Personal history of other malignant neoplasm of skin: Secondary | ICD-10-CM | POA: Insufficient documentation

## 2014-03-20 DIAGNOSIS — K449 Diaphragmatic hernia without obstruction or gangrene: Secondary | ICD-10-CM | POA: Insufficient documentation

## 2014-03-20 DIAGNOSIS — Z90710 Acquired absence of both cervix and uterus: Secondary | ICD-10-CM | POA: Insufficient documentation

## 2014-03-20 DIAGNOSIS — I10 Essential (primary) hypertension: Secondary | ICD-10-CM | POA: Diagnosis not present

## 2014-03-20 DIAGNOSIS — G4733 Obstructive sleep apnea (adult) (pediatric): Secondary | ICD-10-CM | POA: Insufficient documentation

## 2014-03-20 DIAGNOSIS — D649 Anemia, unspecified: Secondary | ICD-10-CM

## 2014-03-20 DIAGNOSIS — Q394 Esophageal web: Secondary | ICD-10-CM

## 2014-03-20 DIAGNOSIS — Z8711 Personal history of peptic ulcer disease: Secondary | ICD-10-CM | POA: Insufficient documentation

## 2014-03-20 HISTORY — PX: ESOPHAGOGASTRODUODENOSCOPY: SHX1529

## 2014-03-20 SURGERY — EGD (ESOPHAGOGASTRODUODENOSCOPY)
Anesthesia: Moderate Sedation

## 2014-03-20 MED ORDER — ONDANSETRON HCL 4 MG/2ML IJ SOLN
INTRAMUSCULAR | Status: DC | PRN
  Administered 2014-03-20: 4 mg via INTRAVENOUS

## 2014-03-20 MED ORDER — MEPERIDINE HCL 100 MG/ML IJ SOLN
INTRAMUSCULAR | Status: AC
  Filled 2014-03-20: qty 2

## 2014-03-20 MED ORDER — LIDOCAINE VISCOUS 2 % MT SOLN
OROMUCOSAL | Status: AC
  Filled 2014-03-20: qty 15

## 2014-03-20 MED ORDER — ONDANSETRON HCL 4 MG/2ML IJ SOLN
INTRAMUSCULAR | Status: AC
  Filled 2014-03-20: qty 2

## 2014-03-20 MED ORDER — MIDAZOLAM HCL 5 MG/5ML IJ SOLN
INTRAMUSCULAR | Status: AC
  Filled 2014-03-20: qty 10

## 2014-03-20 MED ORDER — MIDAZOLAM HCL 5 MG/5ML IJ SOLN
INTRAMUSCULAR | Status: DC | PRN
  Administered 2014-03-20: 2 mg via INTRAVENOUS
  Administered 2014-03-20: 1 mg via INTRAVENOUS

## 2014-03-20 MED ORDER — STERILE WATER FOR IRRIGATION IR SOLN
Status: DC | PRN
  Administered 2014-03-20: 13:00:00

## 2014-03-20 MED ORDER — LIDOCAINE VISCOUS 2 % MT SOLN
OROMUCOSAL | Status: DC | PRN
  Administered 2014-03-20: 4 mL via OROMUCOSAL

## 2014-03-20 MED ORDER — MEPERIDINE HCL 100 MG/ML IJ SOLN
INTRAMUSCULAR | Status: DC | PRN
  Administered 2014-03-20: 50 mg via INTRAVENOUS

## 2014-03-20 MED ORDER — SODIUM CHLORIDE 0.9 % IV SOLN
INTRAVENOUS | Status: DC
  Administered 2014-03-20: 12:00:00 via INTRAVENOUS

## 2014-03-20 NOTE — H&P (View-Only) (Signed)
,    Primary Care Physician:  Delphina Cahill, MD Primary Gastroenterologist:  Dr. Gala Romney  Pre-Procedure History & Physical: HPI:  Marilyn Ortiz is a 53 y.o. female here for followup of rectal bleeding. His microcytic anemia. Recent colonoscopy demonstrated internal canal hemorrhoids and a hyperplastic polyp. 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 Ferritin normal at 172 her iron level is low at his is here TIBC. Had paper hematochezia but resolved with a Anusol suppositories. Rare occasional reflux symptoms. Currently, no GI symptoms. EGD 5 years ago in [facility]. We have yet to receive those records. She was told she had an ulcer. Aside from rare occasional reflux symptoms she really does not have any GI symptoms at this time. She is status post hysterectomy. Weight down about 6 pounds since her last visit. Blood pressure elevated today per  Past Medical History  Diagnosis Date  . Deglutition syncope   . HTN (hypertension)   . Chronic headaches   . OSA (obstructive sleep apnea)   . Dilation of esophagus   . Hyperlipidemia   . Skin cancer (melanoma) ? 2002    Back  . Hyperplastic colon polyp   . Hemorrhoids     Past Surgical History  Procedure Laterality Date  . Pacemaker insertion      St. Jude Accent 2210  . Tubal ligation    . Knee surgery      right  . Esophageal dilation    . Cesarean section      x 2  . Lumbar disc surgery      L4  . Vesicovaginal fistula closure w/ tah    . Bladder suspension    . Abdominal hysterectomy    . Colonoscopy      normal per patient. Around 2010 in [place].  . Colonoscopy N/A 12/27/2013    Dr.Emigdio Wildeman- anal canal hemorrhoids- single rectosigmoid polyp. 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 bx= hyperplastic polyp.    Prior to Admission medications   Medication Sig Start Date End Date Taking? Authorizing Provider  Doxylamine Succinate, Sleep, (UNISOM) 25 MG tablet Take 50 mg by mouth at bedtime as needed for sleep.    Yes Historical  Provider, MD  nebivolol (BYSTOLIC) 5 MG tablet Take 5 mg by mouth daily.   Yes Historical Provider, MD  NON FORMULARY Vitamins for Hair and Nails/ 2 tablets daily   Yes Historical Provider, MD  tiZANidine (ZANAFLEX) 4 MG tablet Take 4-8 mg by mouth at bedtime. As needed   Yes Historical Provider, MD  peg 3350 powder (MOVIPREP) 100 G SOLR Take 1 kit (200 g total) by mouth as directed. 12/08/13   Daneil Dolin, MD    Allergies as of 02/28/2014 - Review Complete 02/28/2014  Allergen Reaction Noted  . Codeine Nausea And Vomiting and Rash   . Morphine and related Nausea And Vomiting and Rash 07/22/2010  . Penicillins Rash     Family History  Problem Relation Age of Onset  . Allergies Maternal Grandmother   . Heart disease      paternal side  . Rheum arthritis Maternal Grandfather   . Ovarian cancer Mother     stage IV, survival  . Colon cancer Other     paternal aunt, age late 49s/  . Lung cancer Neg Hx   . Breast cancer Mother     History   Social History  . Marital Status: Married    Spouse Name: Laelani Vasko    Number of  Children: 3  . Years of Education: N/A   Occupational History  . Childcare     Full-time   Social History Main Topics  . Smoking status: Never Smoker   . Smokeless tobacco: Never Used     Comment: Never smoked  . Alcohol Use: 0.0 oz/week    0 Not specified per week     Comment: wine on occ  . Drug Use: No  . Sexual Activity: Not on file   Other Topics Concern  . Not on file   Social History Narrative    Review of Systems: See HPI, otherwise negative ROS  Physical Exam: BP 161/99 mmHg  Pulse 82  Temp(Src) 97.3 F (36.3 C) (Oral)  Ht _0  (1.626 m)  Wt 212 lb 12.8 oz (96.525 kg)  BMI 36.51 kg/m2 General:   Alert,  Well-developed, well-nourished, pleasant and cooperative in NAD Skin:  Intact without significant lesions or rashes. Eyes:  Sclera clear, no icterus.   Conjunctiva pink. Ears:  Normal auditory acuity. Nose:  No deformity,  discharge,  or lesions. Mouth:  No deformity or lesions. Neck:  Supple; no masses or thyromegaly. No significant cervical adenopathy. Lungs:  Clear throughout to auscultation.   No wheezes, crackles, or rhonchi. No acute distress. Heart:  Regular rate and rhythm; no murmurs, clicks, rubs,  or gallops. Abdomen: Non-distended, normal bowel sounds.  Soft and nontender without appreciable mass or hepatosplenomegaly.  Pulses:  Normal pulses noted. Extremities:  Without clubbing or edema.  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 Ferritin well within normal limits. Hematochezia resolved. No need for hemorrhoid banding at this time. I continue to be interested in getting more information about the "stomach ulcer" she was told she had 5 years ago.  Recommendations:  Stool for occult blood testing today.  Patient to retrieve EGD report.  Further recommendations to follow.    Notice: This dictation was prepared with Dragon dictation along with smaller phrase technology. Any transcriptional errors that result from this process are unintentional and may not be corrected upon review.

## 2014-03-20 NOTE — Interval H&P Note (Signed)
History and Physical Interval Note:  03/20/2014 12:48 PM  Marilyn Ortiz  has presented today for surgery, with the diagnosis of Anemia  The various methods of treatment have been discussed with the patient and family. After consideration of risks, benefits and other options for treatment, the patient has consented to  Procedure(s) with comments: ESOPHAGOGASTRODUODENOSCOPY (EGD) (N/A) - 8638 as a surgical intervention .  The patient's history has been reviewed, patient examined, no change in status, stable for surgery.  I have reviewed the patient's chart and labs.  Questions were answered to the patient's satisfaction.     Marilyn Ortiz  Some dysphagia. May or may not need esophageal dilation. History of gastric ulcer on prior EGD without follow-up. EGD today per plan.The risks, benefits, limitations, alternatives and imponderables have been reviewed with the patient. Potential for esophageal dilation, biopsy, etc. have also been reviewed.  Questions have been answered. All parties agreeable.

## 2014-03-20 NOTE — Interval H&P Note (Signed)
History and Physical Interval Note:  03/20/2014 12:39 PM  Marilyn Ortiz  has presented today for surgery, with the diagnosis of Anemia  The various methods of treatment have been discussed with the patient and family. After consideration of risks, benefits and other options for treatment, the patient has consented to  Procedure(s) with comments: ESOPHAGOGASTRODUODENOSCOPY (EGD) (N/A) - 4163 as a surgical intervention .  The patient's history has been reviewed, patient examined, no change in status, stable for surgery.  I have reviewed the patient's chart and labs.  Questions were answered to the patient's satisfaction.     Manus Rudd  Records from [facility] arrived. Patient had a gastric ulcer with no follow-up endoscopy. Consequently, EGD being done at this time.The risks, benefits, limitations, alternatives and imponderables have been reviewed with the patient. Potential for esophageal dilation, biopsy, etc. have also been reviewed.  Questions have been answered. All parties agreeable.

## 2014-03-20 NOTE — Discharge Instructions (Signed)
EGD Discharge instructions Please read the instructions outlined below and refer to this sheet in the next few weeks. These discharge instructions provide you with general information on caring for yourself after you leave the hospital. Your doctor may also give you specific instructions. While your treatment has been planned according to the most current medical practices available, unavoidable complications occasionally occur. If you have any problems or questions after discharge, please call your doctor. ACTIVITY  You may resume your regular activity but move at a slower pace for the next 24 hours.   Take frequent rest periods for the next 24 hours.   Walking will help expel (get rid of) the air and reduce the bloated feeling in your abdomen.   No driving for 24 hours (because of the anesthesia (medicine) used during the test).   You may shower.   Do not sign any important legal documents or operate any machinery for 24 hours (because of the anesthesia used during the test).  NUTRITION  Drink plenty of fluids.   You may resume your normal diet.   Begin with a light meal and progress to your normal diet.   Avoid alcoholic beverages for 24 hours or as instructed by your caregiver.  MEDICATIONS  You may resume your normal medications unless your caregiver tells you otherwise.  WHAT YOU CAN EXPECT TODAY  You may experience abdominal discomfort such as a feeling of fullness or gas pains.  FOLLOW-UP  Your doctor will discuss the results of your test with you.  SEEK IMMEDIATE MEDICAL ATTENTION IF ANY OF THE FOLLOWING OCCUR:  Excessive nausea (feeling sick to your stomach) and/or vomiting.   Severe abdominal pain and distention (swelling).   Trouble swallowing.   Temperature over 101 F (37.8 C).   Rectal bleeding or vomiting of blood.   GERD information provided  Begin Protonix 40 mg daily  CBC and office visit with [place] in 3 months  Gastroesophageal Reflux Disease,  Adult Gastroesophageal reflux disease (GERD) happens when acid from your stomach flows up into the esophagus. When acid comes in contact with the esophagus, the acid causes soreness (inflammation) in the esophagus. Over time, GERD may create small holes (ulcers) in the lining of the esophagus. CAUSES   Increased body weight. This puts pressure on the stomach, making acid rise from the stomach into the esophagus.  Smoking. This increases acid production in the stomach.  Drinking alcohol. This causes decreased pressure in the lower esophageal sphincter (valve or ring of muscle between the esophagus and stomach), allowing acid from the stomach into the esophagus.  Late evening meals and a full stomach. This increases pressure and acid production in the stomach.  A malformed lower esophageal sphincter. Sometimes, no cause is found. SYMPTOMS   Burning pain in the lower part of the mid-chest behind the breastbone and in the mid-stomach area. This may occur twice a week or more often.  Trouble swallowing.  Sore throat.  Dry cough.  Asthma-like symptoms including chest tightness, shortness of breath, or wheezing. DIAGNOSIS  Your caregiver may be able to diagnose GERD based on your symptoms. In some cases, X-rays and other tests may be done to check for complications or to check the condition of your stomach and esophagus. TREATMENT  Your caregiver may recommend over-the-counter or prescription medicines to help decrease acid production. Ask your caregiver before starting or adding any new medicines.  HOME CARE INSTRUCTIONS   Change the factors that you can control. Ask your caregiver for guidance  concerning weight loss, quitting smoking, and alcohol consumption.  Avoid foods and drinks that make your symptoms worse, such as:  Caffeine or alcoholic drinks.  Chocolate.  Peppermint or mint flavorings.  Garlic and onions.  Spicy foods.  Citrus fruits, such as oranges, lemons, or  limes.  Tomato-based foods such as sauce, chili, salsa, and pizza.  Fried and fatty foods.  Avoid lying down for the 3 hours prior to your bedtime or prior to taking a nap.  Eat small, frequent meals instead of large meals.  Wear loose-fitting clothing. Do not wear anything tight around your waist that causes pressure on your stomach.  Raise the head of your bed 6 to 8 inches with wood blocks to help you sleep. Extra pillows will not help.  Only take over-the-counter or prescription medicines for pain, discomfort, or fever as directed by your caregiver.  Do not take aspirin, ibuprofen, or other nonsteroidal anti-inflammatory drugs (NSAIDs). SEEK IMMEDIATE MEDICAL CARE IF:   You have pain in your arms, neck, jaw, teeth, or back.  Your pain increases or changes in intensity or duration.  You develop nausea, vomiting, or sweating (diaphoresis).  You develop shortness of breath, or you faint.  Your vomit is green, yellow, black, or looks like coffee grounds or blood.  Your stool is red, bloody, or black. These symptoms could be signs of other problems, such as heart disease, gastric bleeding, or esophageal bleeding. MAKE SURE YOU:   Understand these instructions.  Will watch your condition.  Will get help right away if you are not doing well or get worse. Document Released: 01/07/2005 Document Revised: 06/22/2011 Document Reviewed: 10/17/2010 [facility] Patient Information 2015 [place], [place]. This information is not intended to replace advice given to you by your health care provider. Make sure you discuss any questions you have with your health care provider.

## 2014-03-21 NOTE — Op Note (Signed)
[facility] [address] [place], [address]   ENDOSCOPY PROCEDURE REPORT  PATIENT: Marilyn, Ortiz  MR#: [id number] BIRTHDATE: [birth year] , 40  yrs. old GENDER: female ENDOSCOPIST: R.  Garfield Cornea, MD FACP FACG REFERRED BY:  Delphina Cahill, M.D. PROCEDURE DATE:  04-11-2014 PROCEDURE:  EGD, diagnostic and Maloney dilation of esophagus INDICATIONS:  History of gastric ulcer at an outside facility without follow-up EGD.  Mixed anemia.  Esophageal dysphagia.[name] MEDICATIONS: Versed 3 mg IV and Demerol 50 mg IV in divided doses. Zofran 4 mg IV.  Xylocaine gel orally. ASA CLASS:      Class II  CONSENT: The risks, benefits, limitations, alternatives and imponderables have been discussed.  The potential for biopsy, esophogeal dilation, etc. have also been reviewed.  Questions have been answered.  All parties agreeable.  Please see the history and physical in the medical record for more information.  DESCRIPTION OF PROCEDURE: After the risks benefits and alternatives of the procedure were thoroughly explained, informed consent was obtained.  The EG-[id number] ([id number]) endoscope was introduced through the mouth and advanced to the second portion of the duodenum , limited by Without limitations. The instrument was slowly withdrawn as the mucosa was fully examined.    Schatzki's ring present; otherwise, the esophageal mucosa appeared normal..  Stomach empty.  Moderate size hiatal hernia.  No ulceration or infiltrating process.  Patent pylorus.  Normal first and second portion of the duodenum.  Scope was withdrawn, a 81 [place] Maloney dilator was passed to full insertion easily.  A look back revealed the ring was nicely ruptured without apparent complication and with minimal bleeding. Retroflexed views revealed as previously described.     The scope was then withdrawn from the patient and the procedure completed.  COMPLICATIONS: There were no immediate  complications.  ENDOSCOPIC IMPRESSION: Schatzki's ring?"status post dilation as described above. Hiatal hernia.  RECOMMENDATIONS: Begin Protonix 40 mg daily. CBC and office visit in 3 months.   REPEAT EXAM:  eSigned:  R. Garfield Cornea, MD Rosalita Chessman [facility] Apr 11, 2014 1:14 PM    CC:  CPT CODES: ICD CODES:  The ICD and CPT codes recommended by this software are interpretations from the data that the clinical staff has captured with the software.  The verification of the translation of this report to the ICD and CPT codes and modifiers is the sole responsibility of the health care institution and practicing physician where this report was generated.  [employer]. will not be held responsible for the validity of the ICD and CPT codes included on this report.  AMA assumes no liability for data contained or not contained herein. CPT is a Designer, television/film set of the [employer].  PATIENT NAME:  Marilyn, Ortiz MR#: [id number]

## 2014-04-27 ENCOUNTER — Encounter: Payer: Self-pay | Admitting: Cardiology

## 2014-05-02 ENCOUNTER — Encounter: Payer: Self-pay | Admitting: Internal Medicine

## 2014-05-14 ENCOUNTER — Other Ambulatory Visit: Payer: Self-pay

## 2014-05-16 ENCOUNTER — Other Ambulatory Visit: Payer: Self-pay

## 2014-05-16 MED ORDER — PANTOPRAZOLE SODIUM 40 MG PO TBEC: 40.0000 mg | DELAYED_RELEASE_TABLET | Freq: Every day | ORAL | Status: DC

## 2014-06-11 ENCOUNTER — Encounter: Payer: Self-pay | Admitting: Internal Medicine

## 2014-06-11 ENCOUNTER — Ambulatory Visit (INDEPENDENT_AMBULATORY_CARE_PROVIDER_SITE_OTHER): Payer: BLUE CROSS/BLUE SHIELD | Admitting: *Deleted

## 2014-06-11 DIAGNOSIS — I495 Sick sinus syndrome: Secondary | ICD-10-CM

## 2014-06-11 NOTE — Progress Notes (Signed)
Remote pacemaker transmission.   

## 2014-06-14 ENCOUNTER — Telehealth: Payer: Self-pay | Admitting: Internal Medicine

## 2014-06-14 ENCOUNTER — Other Ambulatory Visit: Payer: Self-pay | Admitting: Internal Medicine

## 2014-06-14 ENCOUNTER — Other Ambulatory Visit: Payer: Self-pay

## 2014-06-14 DIAGNOSIS — D649 Anemia, unspecified: Secondary | ICD-10-CM

## 2014-06-14 NOTE — Telephone Encounter (Signed)
Patient called inquiring if she needed to have labs done prior to her appt. Please let her know if she does.

## 2014-06-14 NOTE — Telephone Encounter (Signed)
Per RMR procedure note- pt needs cbc prior to ov. Lab order done and pt is aware.

## 2014-06-15 LAB — CBC WITH DIFFERENTIAL/PLATELET
BASOS ABS: 0.1 10*3/uL (ref 0.0–0.1)
Basophils Relative: 1 % (ref 0–1)
EOS ABS: 0.3 10*3/uL (ref 0.0–0.7)
Eosinophils Relative: 4 % (ref 0–5)
HCT: 37.9 % (ref 36.0–46.0)
HEMOGLOBIN: 12.1 g/dL (ref 12.0–15.0)
LYMPHS ABS: 1.5 10*3/uL (ref 0.7–4.0)
LYMPHS PCT: 24 % (ref 12–46)
MCH: 26.1 pg (ref 26.0–34.0)
MCHC: 31.9 g/dL (ref 30.0–36.0)
MCV: 81.9 fL (ref 78.0–100.0)
MPV: 10 fL (ref 8.6–12.4)
Monocytes Absolute: 0.5 10*3/uL (ref 0.1–1.0)
Monocytes Relative: 8 % (ref 3–12)
Neutro Abs: 4 10*3/uL (ref 1.7–7.7)
Neutrophils Relative %: 63 % (ref 43–77)
Platelets: 222 10*3/uL (ref 150–400)
RBC: 4.63 MIL/uL (ref 3.87–5.11)
RDW: 15.4 % (ref 11.5–15.5)
WBC: 6.4 10*3/uL (ref 4.0–10.5)

## 2014-06-17 LAB — MDC_IDC_ENUM_SESS_TYPE_REMOTE
Battery Remaining Longevity: 87 mo
Battery Voltage: 2.95 V
Brady Statistic AP VP Percent: 1 %
Brady Statistic AP VS Percent: 26 %
Brady Statistic RA Percent Paced: 25 %
Date Time Interrogation Session: 20160229134158
Implantable Pulse Generator Serial Number: 2150303
Lead Channel Impedance Value: 430 Ohm
Lead Channel Pacing Threshold Amplitude: 1.25 V
Lead Channel Pacing Threshold Pulse Width: 0.4 ms
Lead Channel Sensing Intrinsic Amplitude: 2.1 mV
Lead Channel Setting Pacing Pulse Width: 0.4 ms
Lead Channel Setting Sensing Sensitivity: 0.5 mV
MDC IDC MSMT BATTERY REMAINING PERCENTAGE: 74 %
MDC IDC MSMT LEADCHNL RA IMPEDANCE VALUE: 400 Ohm
MDC IDC MSMT LEADCHNL RA PACING THRESHOLD AMPLITUDE: 0.75 V
MDC IDC MSMT LEADCHNL RV PACING THRESHOLD PULSEWIDTH: 0.4 ms
MDC IDC MSMT LEADCHNL RV SENSING INTR AMPL: 6.9 mV
MDC IDC SET LEADCHNL RA PACING AMPLITUDE: 2 V
MDC IDC SET LEADCHNL RV PACING AMPLITUDE: 2.5 V
MDC IDC STAT BRADY AS VP PERCENT: 1 %
MDC IDC STAT BRADY AS VS PERCENT: 73 %
MDC IDC STAT BRADY RV PERCENT PACED: 1 %

## 2014-06-19 ENCOUNTER — Encounter: Payer: Self-pay | Admitting: Nurse Practitioner

## 2014-06-19 ENCOUNTER — Ambulatory Visit (INDEPENDENT_AMBULATORY_CARE_PROVIDER_SITE_OTHER): Payer: BLUE CROSS/BLUE SHIELD | Admitting: Nurse Practitioner

## 2014-06-19 VITALS — BP 137/99 | HR 79 | Temp 98.9°F | Ht 63.0 in | Wt 220.4 lb

## 2014-06-19 DIAGNOSIS — Q394 Esophageal web: Secondary | ICD-10-CM

## 2014-06-19 DIAGNOSIS — K222 Esophageal obstruction: Secondary | ICD-10-CM

## 2014-06-19 DIAGNOSIS — K625 Hemorrhage of anus and rectum: Secondary | ICD-10-CM

## 2014-06-19 DIAGNOSIS — D649 Anemia, unspecified: Secondary | ICD-10-CM

## 2014-06-19 NOTE — Progress Notes (Signed)
Referring Provider: Delphina Cahill, MD Primary Care Physician:  Delphina Cahill, MD Primary GI:  Dr. Gala Romney  Chief Complaint  Patient presents with  . Follow-up    doing ok    HPI:   54 year old female presents for follow-up on EGD done 03/20/14. At last office visit tissue hematochezia had completely resolved with Anusol. TCS 12/27/13 with adequate prep, anal canal hemorrhoids, and 1 dimminuative rectosigmoid junction polyp which was found to be hyperplastic on pathology. Per patient, has a history of gastric ulcers, no recordes seen yet confirming this. EGD done 03/20/14 found Schatzki's ring which was dilated with a 2 Fr Maloney dilator as well as a hiatal hernia. Esophageal and gastric mucosa otherwise normal. Recommended to begin Protonix 40 mg daily with a follow-up in 3 months and CBC prior to this.  Today a review of her labs show a normalized CBC with H/H 12.1/37.9. Has been taking Protonix daily. Denies dyspepsia/GERD symptoms, no further hematochezia. Denies abdominal pain, N/V/D. Has a bowel movement daily which is soft and formed without straining. Has a daily morning toileting routine which works well for her. Denies fever, chills, unintended weight loss, change in appetite, chest pain, syncopy, or dyspnea. Deneis any other upper or lower GI symptoms.  Past Medical History  Diagnosis Date  . Deglutition syncope   . HTN (hypertension)   . Chronic headaches   . OSA (obstructive sleep apnea)   . Dilation of esophagus   . Hyperlipidemia   . Skin cancer (melanoma) ? 2002    Back  . Hyperplastic colon polyp   . Hemorrhoids     Past Surgical History  Procedure Laterality Date  . Pacemaker insertion      St. Jude Accent 2210  . Tubal ligation    . Knee surgery      right  . Esophageal dilation    . Cesarean section      x 2  . Lumbar disc surgery      L4  . Vesicovaginal fistula closure w/ tah    . Bladder suspension    . Abdominal hysterectomy    . Colonoscopy     normal per patient. Around 2010 in [place].  . Colonoscopy N/A 12/27/2013    Dr.Rourk- anal canal hemorrhoids- single rectosigmoid polyp. 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 bx= hyperplastic polyp.  . Esophagogastroduodenoscopy  03/20/2014    RMR: Schazki's ring status-post dilation as described above. Hiatal hernia.     Current Outpatient Prescriptions  Medication Sig Dispense Refill  . Doxylamine Succinate, Sleep, (UNISOM) 25 MG tablet Take 50 mg by mouth at bedtime as needed for sleep.     . Multiple Vitamins-Minerals (HAIR/SKIN/NAILS PO) Take 2 tablets by mouth daily.    . nebivolol (BYSTOLIC) 5 MG tablet Take 5 mg by mouth daily.    . pantoprazole (PROTONIX) 40 MG tablet Take 1 tablet (40 mg total) by mouth daily. 30 tablet 5  . tiZANidine (ZANAFLEX) 4 MG tablet Take 4-8 mg by mouth at bedtime. As needed     No current facility-administered medications for this visit.    Allergies as of 06/19/2014 - Review Complete 06/19/2014  Allergen Reaction Noted  . Codeine Nausea And Vomiting and Rash   . Morphine and related Nausea And Vomiting and Rash 07/22/2010  . Penicillins Rash     Family History  Problem Relation Age of Onset  . Allergies Maternal Grandmother   . Heart disease      paternal side  .  Rheum arthritis Maternal Grandfather   . Ovarian cancer Mother     stage IV, survival  . Colon cancer Other     paternal aunt, age late 2s/  . Lung cancer Neg Hx   . Breast cancer Mother     History   Social History  . Marital Status: Married    Spouse Name: Katelee Schupp  . Number of Children: 3  . Years of Education: N/A   Occupational History  . Childcare     Full-time   Social History Main Topics  . Smoking status: Never Smoker   . Smokeless tobacco: Never Used     Comment: Never smoked  . Alcohol Use: 0.0 oz/week    0 Standard drinks or equivalent per week     Comment: wine on occ  . Drug Use: No  . Sexual Activity: Not on file   Other Topics Concern    . None   Social History Narrative    Review of Systems: Gen: Denies fever, chills, fatigue, weakness, weight loss.  CV: Denies chest pain, palpitations, syncope. Resp: Denies dyspnea at rest, wheezing. GI: See HPI. Denies vomiting blood, jaundice.   Denies dysphagia or odynophagia. Derm: Denies rash, itching, dry skin Psych: Denies depression, anxiety, memory loss, confusion. Heme: Denies bruising, bleeding, and enlarged lymph nodes.  Physical Exam: BP 137/99 mmHg  Pulse 79  Temp(Src) 98.9 F (37.2 C) (Oral)  Ht 5\' 3"  (1.6 m)  Wt 220 lb 6.4 oz (99.973 kg)  BMI 39.05 kg/m2 General:   Alert and oriented. No distress noted. Pleasant and cooperative.  Head:  Normocephalic and atraumatic. Eyes:  Conjuctiva clear without scleral icterus. Lungs:  Clear to auscultation bilaterally. No wheezes, rales, or rhonchi. No distress.  Heart:  S1, S2 present without murmurs, rubs, or gallops. Regular rate and rhythm. Abdomen:  +BS, soft, non-tender and non-distended. No rebound or guarding. No HSM or masses noted. Msk:  Symmetrical without gross deformities. Normal posture. Neurologic:  Alert and  oriented x4;  grossly normal neurologically. Skin:  Intact without significant lesions or rashes. Psych:  Alert and cooperative. Normal mood and affect.    06/19/2014 9:23 AM

## 2014-06-19 NOTE — Patient Instructions (Addendum)
1. Continue taking your Protonix medication 2. Follow-up as needed for any new bleeding, change or worsening symptoms.

## 2014-06-21 NOTE — Assessment & Plan Note (Signed)
Goo result after dilation. No further hematochezia after Anusol. Symptoms have resolved including anemia with last H/H 12.1/37.9. Continues on daily Protonix with good control of GERD symptoms. No over GI bleed noted. Daily toileting routine works well for her at this time. Follow-up as needed for any change or worsening in symptoms. Essentially asymptomatic from a GI standpoint. Continue Protonix.

## 2014-06-21 NOTE — Assessment & Plan Note (Signed)
No further hematochezia after Anusol. Symptoms have resolved including anemia with last H/H 12.1/37.9. Continues on daily Protonix with good control of GERD symptoms. No over GI bleed noted. Daily toileting routine works well for her at this time. Follow-up as needed for any change or worsening in symptoms. Essentially asymptomatic from a GI standpoint. Continue Protonix.

## 2014-06-22 NOTE — Progress Notes (Signed)
cc'ed to pcp °

## 2014-07-16 ENCOUNTER — Encounter: Payer: Self-pay | Admitting: Cardiology

## 2014-09-06 ENCOUNTER — Encounter: Payer: Self-pay | Admitting: Internal Medicine

## 2014-09-06 ENCOUNTER — Ambulatory Visit (INDEPENDENT_AMBULATORY_CARE_PROVIDER_SITE_OTHER): Payer: 59 | Admitting: Internal Medicine

## 2014-09-06 VITALS — BP 122/86 | HR 86 | Ht 64.0 in | Wt 223.6 lb

## 2014-09-06 DIAGNOSIS — R55 Syncope and collapse: Secondary | ICD-10-CM | POA: Diagnosis not present

## 2014-09-06 DIAGNOSIS — I495 Sick sinus syndrome: Secondary | ICD-10-CM | POA: Diagnosis not present

## 2014-09-06 DIAGNOSIS — Z45018 Encounter for adjustment and management of other part of cardiac pacemaker: Secondary | ICD-10-CM

## 2014-09-06 NOTE — Patient Instructions (Addendum)
Medication Instructions:  Your physician recommends that you continue on your current medications as directed. Please refer to the Current Medication list given to you today.  Labwork: None ordered  Testing/Procedures: None ordered  Follow-Up: Remote monitoring is used to monitor your pacemaker from home. This monitoring reduces the number of office visits required to check your device to one time per year. It allows [place] to keep an eye on the functioning of your device to ensure it is working properly. You are scheduled for a device check from home on 12/06/2014. You may send your transmission at any time that day. If you have a wireless device, the transmission will be sent automatically. After your physician reviews your transmission, you will receive a postcard with your next transmission date.  Your physician recommends that you schedule a follow-up appointment in: 12 months with Dr.Klein  We had you sign a medical release so that we may send your records to physician you establish with once you move.  Thank you for choosing [facility]!!

## 2014-09-06 NOTE — Progress Notes (Signed)
Patient Care Team: Delphina Cahill, MD as PCP - General (Internal Medicine) Deboraha Sprang, MD as Referring Physician (Cardiology) Daneil Dolin, MD as Consulting Physician (Gastroenterology)   HPI  Marilyn Ortiz is a 54 y.o. 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  She also has a history of fatigue and sleep apnea.     She has had no recurrent syncope. She has hypertension. At our last visit we add antihypertensives. She went to [facility]. They were not willing to refill her medication because she was not hypertensive at that time. This is rather frustrating for her. She called here looking for assistance and without knowing the above we encouraged her to go back to her PCP  Past Medical History  Diagnosis Date  . Deglutition syncope   . HTN (hypertension)   . Chronic headaches   . OSA (obstructive sleep apnea)   . Dilation of esophagus   . Hyperlipidemia   . Skin cancer (melanoma) ? 2002    Back  . Hyperplastic colon polyp   . Hemorrhoids     Past Surgical History  Procedure Laterality Date  . Pacemaker insertion      St. Jude Accent 2210  . Tubal ligation    . Knee surgery      right  . Esophageal dilation    . Cesarean section      x 2  . Lumbar disc surgery      L4  . Vesicovaginal fistula closure w/ tah    . Bladder suspension    . Abdominal hysterectomy    . Colonoscopy      normal per patient. Around 2010 in [place].  . Colonoscopy N/A 12/27/2013    Dr.Rourk- anal canal hemorrhoids- single rectosigmoid polyp. 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 bx= hyperplastic polyp.  . Esophagogastroduodenoscopy  03/20/2014    RMR: Schazki's ring status-post dilation as described above. Hiatal hernia.     Current Outpatient Prescriptions  Medication Sig Dispense Refill  . Doxylamine Succinate, Sleep, (UNISOM) 25 MG tablet Take 50 mg by mouth at bedtime as needed for sleep.     . Multiple  Vitamins-Minerals (HAIR/SKIN/NAILS PO) Take 2 tablets by mouth daily.    . nebivolol (BYSTOLIC) 5 MG tablet Take 5 mg by mouth daily.    . pantoprazole (PROTONIX) 40 MG tablet Take 1 tablet (40 mg total) by mouth daily. 30 tablet 5  . tiZANidine (ZANAFLEX) 4 MG tablet Take 4-8 mg by mouth at bedtime.      No current facility-administered medications for this visit.    Allergies  Allergen Reactions  . Codeine Nausea And Vomiting and Rash  . Morphine And Related Nausea And Vomiting and Rash  . Penicillins Rash    Review of Systems negative except from HPI and PMH  Physical Exam BP 122/86 mmHg  Pulse 86  Ht 5\' 4"  (1.626 m)  Wt 223 lb 9.6 oz (101.424 kg)  BMI 38.36 kg/m2 Well developed and nourished in no acute distress HENT normal Neck supple with JVP-flat Clear Regular rate and rhythm, no murmurs or gallops Abd-soft with active BS No Clubbing cyanosis edema Skin-warm and dry A & Oriented  Grossly normal sensory and motor function Device pocket well healed; without hematoma or erythema.  There is no tethering   ECG demonstrated sinus rhythm at 80 695 intervals 18/07/36 neck sign axis XXXIX Low-voltage Nonspecific T-wave flattening  Assessment and  Plan  Deglutition syncope.   Pacemaker-St. Jude The patient's device was interrogated.  The information was reviewed. No changes were made in the programming.     Hypertension  Blood pressure is well-controlled  No intercurrent syncope

## 2014-10-05 ENCOUNTER — Ambulatory Visit (HOSPITAL_COMMUNITY)
Admission: RE | Admit: 2014-10-05 | Discharge: 2014-10-05 | Disposition: A | Discharge status: Home / Self Care | Payer: 59 | Source: Ambulatory Visit | Attending: Internal Medicine | Admitting: Internal Medicine

## 2014-10-05 ENCOUNTER — Other Ambulatory Visit (HOSPITAL_COMMUNITY): Payer: Self-pay | Admitting: Internal Medicine

## 2014-10-05 DIAGNOSIS — M79671 Pain in right foot: Secondary | ICD-10-CM | POA: Diagnosis not present

## 2014-10-05 DIAGNOSIS — R2241 Localized swelling, mass and lump, right lower limb: Secondary | ICD-10-CM | POA: Insufficient documentation

## 2014-10-05 DIAGNOSIS — R609 Edema, unspecified: Secondary | ICD-10-CM

## 2014-11-28 ENCOUNTER — Other Ambulatory Visit: Payer: Self-pay

## 2014-11-28 MED ORDER — PANTOPRAZOLE SODIUM 40 MG PO TBEC: 40.0000 mg | DELAYED_RELEASE_TABLET | Freq: Every day | ORAL | Status: DC

## 2014-12-06 ENCOUNTER — Encounter: Payer: 59 | Admitting: *Deleted

## 2014-12-06 ENCOUNTER — Telehealth: Payer: Self-pay | Admitting: Cardiology

## 2014-12-06 NOTE — Telephone Encounter (Signed)
LMOVM reminding pt to send remote transmission.   

## 2014-12-11 ENCOUNTER — Encounter: Payer: Self-pay | Admitting: Cardiology

## 2015-01-09 ENCOUNTER — Encounter: Payer: Self-pay | Admitting: *Deleted

## 2015-02-12 ENCOUNTER — Encounter: Payer: Self-pay | Admitting: *Deleted

## 2015-03-11 ENCOUNTER — Encounter: Payer: Self-pay | Admitting: *Deleted

## 2015-04-09 ENCOUNTER — Encounter: Payer: Self-pay | Admitting: *Deleted

## 2015-05-15 ENCOUNTER — Encounter: Payer: Self-pay | Admitting: *Deleted

## 2015-08-28 IMAGING — CR DG SHOULDER 2+V*R*
3 series · 3 of 3 positions shown · non-contrast
Comparison: None.

CLINICAL DATA: Right shoulder pain after moving heavy object

EXAM:
RIGHT SHOULDER - 2+ VIEW

[view not recorded (1 of 3)]
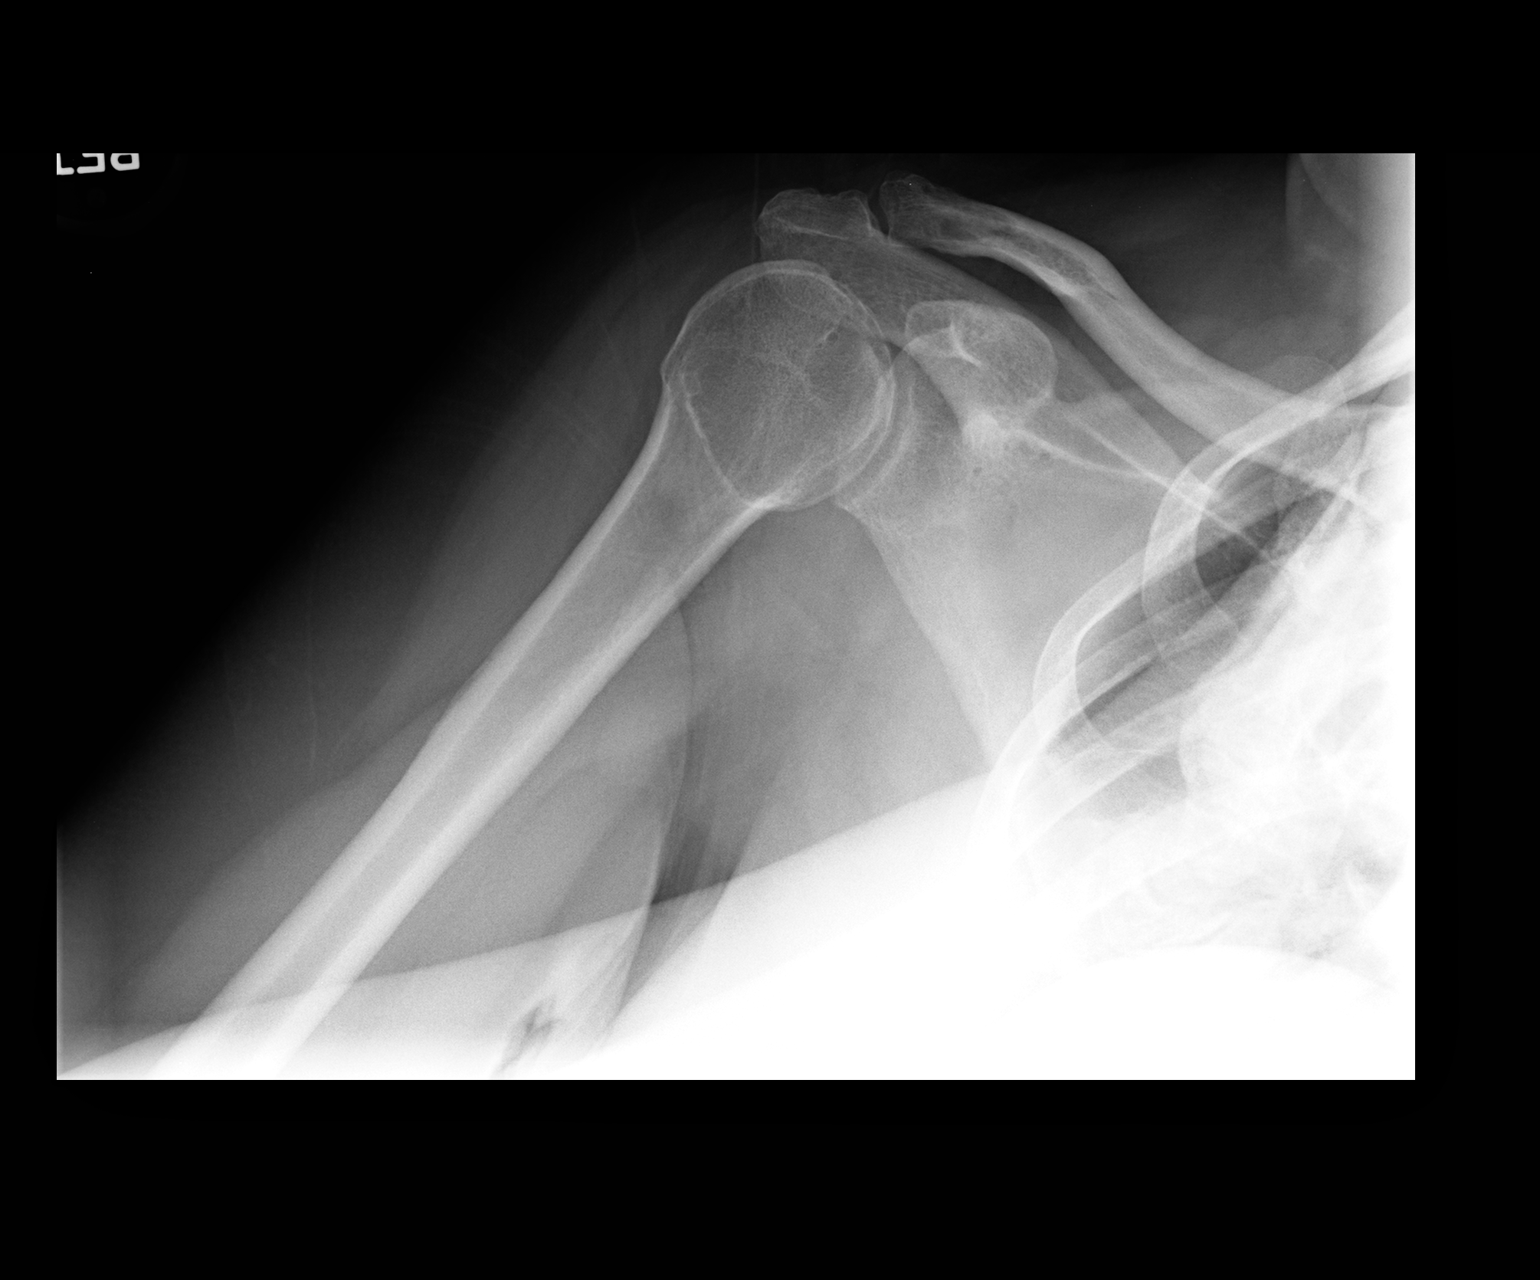

[view not recorded (2 of 3)]
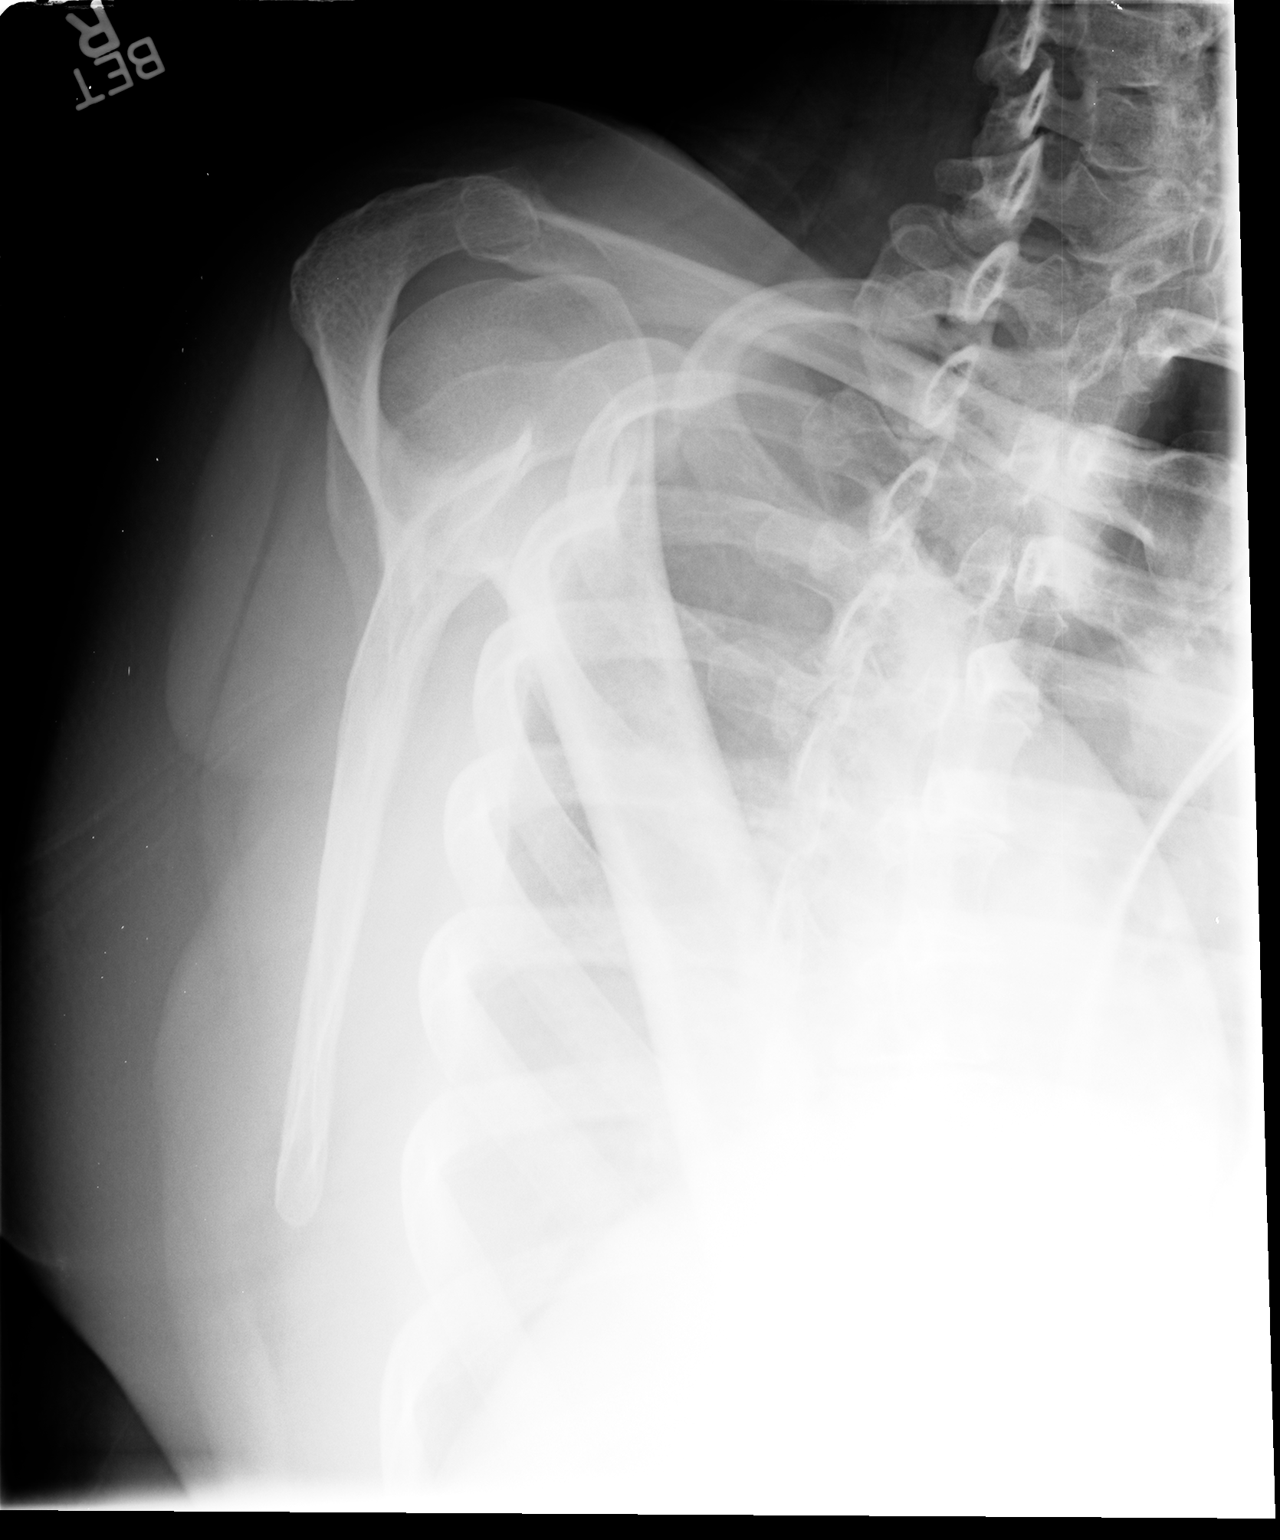

[view not recorded (3 of 3)]
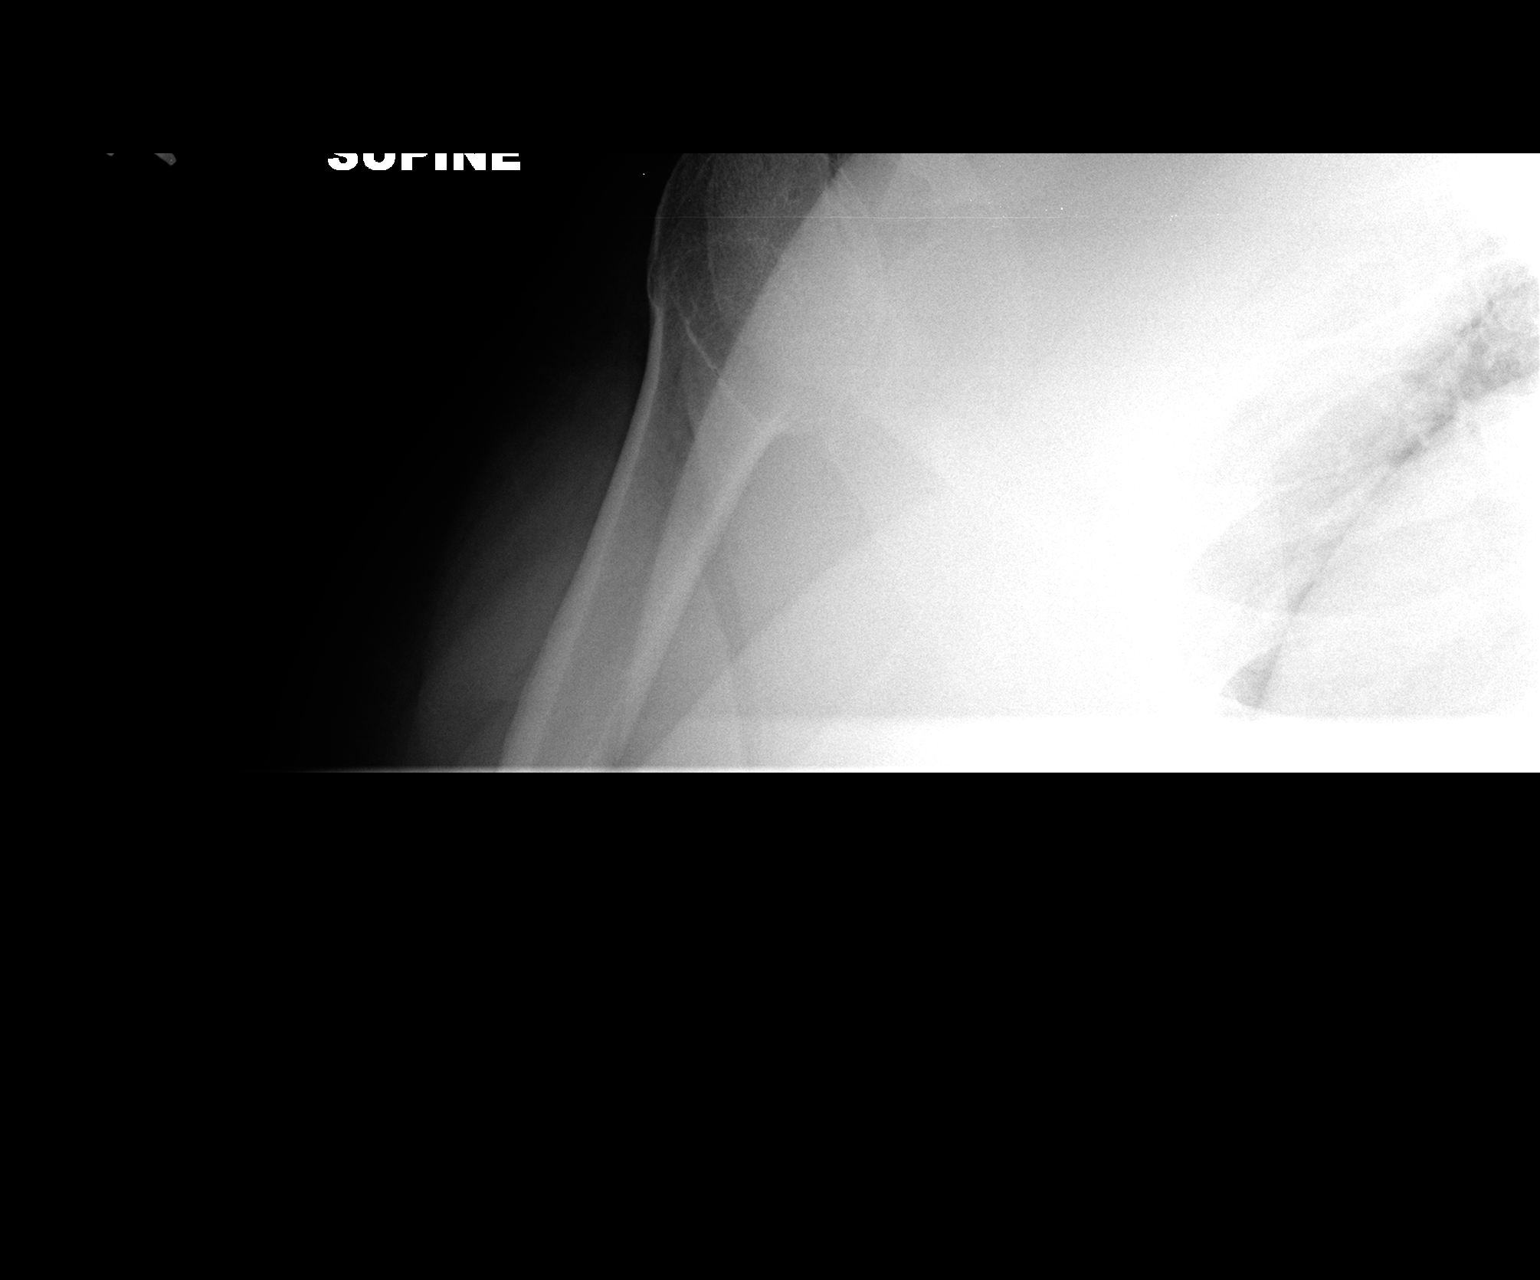

[3 of 3 positions shown; findings below may reference images not displayed]

FINDINGS: There is no evidence of fracture or dislocation. There is no
evidence of arthropathy or other focal bone abnormality. Soft
tissues are unremarkable.
IMPRESSION: No acute osseous injury of the right shoulder.

## 2022-08-18 ENCOUNTER — Encounter (HOSPITAL_BASED_OUTPATIENT_CLINIC_OR_DEPARTMENT_OTHER): Payer: Self-pay

## 2022-08-18 ENCOUNTER — Other Ambulatory Visit (HOSPITAL_BASED_OUTPATIENT_CLINIC_OR_DEPARTMENT_OTHER): Payer: Self-pay

## 2022-08-18 ENCOUNTER — Other Ambulatory Visit: Payer: Self-pay

## 2022-08-18 ENCOUNTER — Emergency Department (HOSPITAL_BASED_OUTPATIENT_CLINIC_OR_DEPARTMENT_OTHER)
Admission: EM | Admit: 2022-08-18 | Discharge: 2022-08-18 | Disposition: A | Discharge status: Home / Self Care | Payer: BC Managed Care – PPO | Attending: Emergency Medicine | Admitting: Emergency Medicine

## 2022-08-18 ENCOUNTER — Emergency Department (HOSPITAL_BASED_OUTPATIENT_CLINIC_OR_DEPARTMENT_OTHER): Payer: BC Managed Care – PPO | Admitting: Radiology

## 2022-08-18 DIAGNOSIS — J168 Pneumonia due to other specified infectious organisms: Secondary | ICD-10-CM | POA: Diagnosis not present

## 2022-08-18 DIAGNOSIS — J189 Pneumonia, unspecified organism: Secondary | ICD-10-CM

## 2022-08-18 DIAGNOSIS — R059 Cough, unspecified: Secondary | ICD-10-CM | POA: Diagnosis present

## 2022-08-18 DIAGNOSIS — Z1152 Encounter for screening for COVID-19: Secondary | ICD-10-CM | POA: Diagnosis not present

## 2022-08-18 LAB — RESP PANEL BY RT-PCR (RSV, FLU A&B, COVID)  RVPGX2
Influenza A by PCR: NEGATIVE
Influenza B by PCR: NEGATIVE
Resp Syncytial Virus by PCR: NEGATIVE
SARS Coronavirus 2 by RT PCR: NEGATIVE

## 2022-08-18 LAB — GROUP A STREP BY PCR: Group A Strep by PCR: NOT DETECTED

## 2022-08-18 MED ORDER — ONDANSETRON 4 MG PO TBDP
4.0000 mg | ORAL_TABLET | ORAL | 0 refills | Status: DC | PRN
  Filled 2022-08-18: qty 20, 4d supply, fill #0

## 2022-08-18 MED ORDER — BENZONATATE 100 MG PO CAPS
100.0000 mg | ORAL_CAPSULE | Freq: Three times a day (TID) | ORAL | 0 refills | Status: DC
  Filled 2022-08-18: qty 21, 7d supply, fill #0

## 2022-08-18 MED ORDER — DOXYCYCLINE HYCLATE 100 MG PO CAPS
100.0000 mg | ORAL_CAPSULE | Freq: Two times a day (BID) | ORAL | 0 refills | Status: DC
  Filled 2022-08-18: qty 14, 7d supply, fill #0

## 2022-08-18 MED ORDER — CEPHALEXIN 500 MG PO CAPS
1000.0000 mg | ORAL_CAPSULE | Freq: Two times a day (BID) | ORAL | 0 refills | Status: AC
  Filled 2022-08-18: qty 28, 7d supply, fill #0

## 2022-08-18 NOTE — ED Notes (Signed)
Discharge paperwork given and verbally understood. 

## 2022-08-18 NOTE — ED Triage Notes (Signed)
Patient here POV from Home.  Endorses Flu-Like Symptoms including Fever, Productive Cough, Congestion for about 3 Weeks. Began taking Levofloxacin and Prednisone without relief. Seeks Evaluation for SOB.   NAD Noted during Triage. A&Ox4. GCS 15. Ambulatory.

## 2022-08-18 NOTE — Discharge Instructions (Signed)
Take tylenol 2 pills 4 times a day and motrin 4 pills 3 times a day.  Drink plenty of fluids.  Return for worsening shortness of breath, headache, confusion. Follow up with your family doctor.   

## 2022-08-18 NOTE — ED Provider Notes (Signed)
EMERGENCY DEPARTMENT AT [facility] Provider Note   CSN: [id number] Arrival date & time: 08/18/22  1151     History  Chief Complaint  Patient presents with   Shortness of Breath    Marilyn Ortiz is a 62 y.o. female.  62 yo F with a chief complaints of cough congestion going on for the past 3 weeks.  She tells me she was initially diagnosed with the flu things gotten better and then over the past 24 to 48 hours of gotten worse.  She has had some mild nausea worsening cough feels like maybe is getting fevers again.   Shortness of Breath      Home Medications Prior to Admission medications   Medication Sig Start Date End Date Taking? Authorizing Provider  benzonatate (TESSALON) 100 MG capsule Take 1 capsule (100 mg total) by mouth every 8 (eight) hours. 08/18/22  Yes Melene Plan, DO  benzonatate (TESSALON) 100 MG capsule Take 1 capsule (100 mg total) by mouth every 8 (eight) hours. 08/18/22  Yes Melene Plan, DO  cephALEXin (KEFLEX) 500 MG capsule Take 2 capsules (1,000 mg total) by mouth 2 (two) times daily for 7 days. 08/18/22 08/25/22 Yes Melene Plan, DO  doxycycline (VIBRAMYCIN) 100 MG capsule Take 1 capsule (100 mg total) by mouth 2 (two) times daily. One po bid x 7 days 08/18/22  Yes Melene Plan, DO  ondansetron (ZOFRAN-ODT) 4 MG disintegrating tablet Take 1 tablet (4 mg total) by mouth every 4 (four) hours as needed for nausea or vomiting 08/18/22  Yes Melene Plan, DO  Doxylamine Succinate, Sleep, (UNISOM) 25 MG tablet Take 50 mg by mouth at bedtime as needed for sleep.     [provider]  Multiple Vitamins-Minerals (HAIR/SKIN/NAILS PO) Take 2 tablets by mouth daily.    [provider]  nebivolol (BYSTOLIC) 5 MG tablet Take 5 mg by mouth daily.    [provider]  pantoprazole (PROTONIX) 40 MG tablet Take 1 tablet (40 mg total) by mouth daily. 11/28/14   Tiffany Kocher, PA-C  tiZANidine (ZANAFLEX) 4 MG tablet Take 4-8 mg by mouth at bedtime.      [provider]      Allergies    Codeine, Morphine and related, and Penicillins    Review of Systems   Review of Systems  Respiratory:  Positive for shortness of breath.     Physical Exam Updated Vital Signs BP (!) 136/90 (BP Location: Right Arm)   Pulse 100   Temp 98.3 F (36.8 C) (Oral)   Resp 16   Ht 5\' 4"  (1.626 m)   Wt 101.4 kg   SpO2 97%   BMI 38.37 kg/m  Physical Exam Vitals and nursing note reviewed.  Constitutional:      General: She is not in acute distress.    Appearance: She is well-developed. She is not diaphoretic.  HENT:     Head: Normocephalic and atraumatic.  Eyes:     Pupils: Pupils are equal, round, and reactive to light.  Cardiovascular:     Rate and Rhythm: Normal rate and regular rhythm.     Heart sounds: No murmur heard.    No friction rub. No gallop.  Pulmonary:     Effort: Pulmonary effort is normal.     Breath sounds: No wheezing or rales.  Abdominal:     General: There is no distension.     Palpations: Abdomen is soft.     Tenderness: There is no abdominal  tenderness.  Musculoskeletal:        General: No tenderness.     Cervical back: Normal range of motion and neck supple.  Skin:    General: Skin is warm and dry.  Neurological:     Mental Status: She is alert and oriented to person, place, and time.  Psychiatric:        Behavior: Behavior normal.     ED Results / Procedures / Treatments   Labs (all labs ordered are listed, but only abnormal results are displayed) Labs Reviewed  RESP PANEL BY RT-PCR (RSV, FLU A&B, COVID)  [id number]  GROUP A STREP BY PCR    EKG EKG Interpretation  Date/Time:  Tuesday Aug 18 2022 12:29:24 EDT Ventricular Rate:  70 PR Interval:  170 QRS Duration: 80 QT Interval:  394 QTC Calculation: 425 R Axis:   25 Text Interpretation: Atrial-paced rhythm Low voltage QRS Cannot rule out Anterior infarct , age undetermined Abnormal ECG Otherwise no significant change Confirmed by Melene Plan  [phone]) on 08/18/2022 1:06:11 PM  Radiology DG Chest 2 View  Result Date: 08/18/2022 CLINICAL DATA:  Provided history: Shortness of breath. Additional history provided by the scanning technologist: The patient reports being diagnosed with flu a few weeks ago, ongoing weakness. EXAM: CHEST - 2 VIEW COMPARISON:  Prior chest radiographs 02/16/2008 and earlier. FINDINGS: Left chest multi-lead implantable cardiac device. Heart size within normal limits. Mild ill-defined opacity within the left lung base. Mild linear atelectasis or scarring in the perihilar right lung. No evidence of pleural effusion or pneumothorax. No acute osseous abnormality identified. Degenerative changes of the spine. IMPRESSION: 1. Mild ill-defined opacity within the left lung base. The appearance favors atelectasis. However, pneumonia cannot be excluded. Correlate clinically and consider short-interval radiographic follow-up. 2. Mild linear atelectasis or scarring within the perihilar right lung. Electronically Signed   By: Jackey Loge D.O.   On: 08/18/2022 13:03    Procedures Procedures    Medications Ordered in ED Medications - No data to display  ED Course/ Medical Decision Making/ A&P                             Medical Decision Making Amount and/or Complexity of Data Reviewed Radiology: ordered.  Risk Prescription drug management.   62 yo F with what sounds like a bacterial and viral syndrome, diagnosed with a flulike illness had some improvement and then worsening over the past couple days.  She is well appearing.  Has clear lung sounds for me.  Chest x-ray independently interpreted by me without obvious focal infiltrate.  Radiology read with possible left lobar pneumonia.  Will treat with antibiotics.  PCP follow-up.  2:13 PM:  I have discussed the diagnosis/risks/treatment options with the patient and family.  Evaluation and diagnostic testing in the emergency department does not suggest an emergent condition  requiring admission or immediate intervention beyond what has been performed at this time.  They will follow up with PCP. We also discussed returning to the ED immediately if new or worsening sx occur. We discussed the sx which are most concerning (e.g., sudden worsening pain, fever, inability to tolerate by mouth) that necessitate immediate return. Medications administered to the patient during their visit and any new prescriptions provided to the patient are listed below.  Medications given during this visit Medications - No data to display   The patient appears reasonably screen and/or stabilized for discharge and I doubt any other  medical condition or other [facility] requiring further screening, evaluation, or treatment in the ED at this time prior to discharge.          Final Clinical Impression(s) / ED Diagnoses Final diagnoses:  Pneumonia of left lower lobe due to infectious organism    Rx / DC Orders ED Discharge Orders          Ordered    doxycycline (VIBRAMYCIN) 100 MG capsule  2 times daily        08/18/22 1336    cephALEXin (KEFLEX) 500 MG capsule  2 times daily        08/18/22 1336    benzonatate (TESSALON) 100 MG capsule  Every 8 hours        08/18/22 1336    ondansetron (ZOFRAN-ODT) 4 MG disintegrating tablet  Every 4 hours PRN        08/18/22 1336    benzonatate (TESSALON) 100 MG capsule  Every 8 hours        08/18/22 1336              Melene Plan, DO 08/18/22 1413

## 2023-10-22 ENCOUNTER — Ambulatory Visit
Admission: EM | Admit: 2023-10-22 | Discharge: 2023-10-22 | Disposition: A | Discharge status: Home / Self Care | Attending: Family Medicine | Admitting: Family Medicine

## 2023-10-22 DIAGNOSIS — E860 Dehydration: Secondary | ICD-10-CM | POA: Diagnosis not present

## 2023-10-22 DIAGNOSIS — R5383 Other fatigue: Secondary | ICD-10-CM

## 2023-10-22 LAB — POCT URINALYSIS DIP (MANUAL ENTRY)
Bilirubin, UA: NEGATIVE
Blood, UA: NEGATIVE
Glucose, UA: NEGATIVE mg/dL
Leukocytes, UA: NEGATIVE
Nitrite, UA: NEGATIVE
Spec Grav, UA: 1.025 (ref 1.010–1.025)
Urobilinogen, UA: 0.2 U/dL
pH, UA: 5.5 (ref 5.0–8.0)

## 2023-10-22 MED ORDER — SODIUM CHLORIDE 0.9 % IV BOLUS
1000.0000 mL | Freq: Once | INTRAVENOUS | Status: AC
  Administered 2023-10-22: 1000 mL via INTRAVENOUS

## 2023-10-22 NOTE — Discharge Instructions (Addendum)
 Encouraged patient to increase daily water  intake to 64 ounces per day 7 days/week.  Advised patient if fatigue worsens and/or unresolved please follow-up with your PCP or here for further evaluation.  Contact information provided for [facility] primary care provider.

## 2023-10-22 NOTE — ED Triage Notes (Signed)
 Pt presents to [facility] with co bilateral leg cramps, and fatigue. Pt reports she recently moved here with her husband and they did all the work and she thinks she may have gotten dehydrated from the activity.

## 2023-10-22 NOTE — ED Provider Notes (Signed)
 [facility]    CSN: [id number] Arrival date & time: 10/22/23  1151      History   Chief Complaint Chief Complaint  Patient presents with   Fatigue   leg cramps    HPI LOGEN FOWLE is a 63 y.o. female.   HPI Pleasant 63 year old female presents with bilateral leg cramps and fatigue.  Patient reports she has recently moved here with her husband.  Reports her and her husband have done the majority of their moving on their own and and she believes all the work she may be dehydrated.  PMH significant for morbid/severe obesity, sick sinus syndrome (with cardiac pacemaker in place), T2 DM (uncontrolled A1c 9.5 on 07/15/23).  Past Medical History:  Diagnosis Date   Chronic headaches    Deglutition syncope    Dilation of esophagus    Hemorrhoids    HTN (hypertension)    Hyperlipidemia    Hyperplastic colon polyp    OSA (obstructive sleep apnea)    Skin cancer (melanoma) (HCC) ? 2002   Back    Patient Active Problem List   Diagnosis Date Noted   Hiatal hernia    Schatzki's ring    Rectal bleeding 09/18/2013   Anemia 09/18/2013   Sinus tachycardia-relative 08/11/2012   Sinoatrial node dysfunction (HCC) 07/24/2011   Deglutition syncope 07/22/2010   Pacemaker-St. Jude's 07/22/2010   Fatigue 07/22/2010   HYPERTENSION, BENIGN 02/19/2009   Sleep apnea 02/19/2009    Past Surgical History:  Procedure Laterality Date   ABDOMINAL HYSTERECTOMY     BLADDER SUSPENSION     CESAREAN SECTION     x 2   COLONOSCOPY     normal per patient. Around 2010 in [facility].   COLONOSCOPY N/A 12/27/2013   Dr.Rourk- anal canal hemorrhoids- single rectosigmoid polyp. 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 bx= hyperplastic polyp.   ESOPHAGEAL DILATION     ESOPHAGOGASTRODUODENOSCOPY  03/20/2014   RMR: Schazki's ring status-post dilation as described above. Hiatal hernia.    KNEE SURGERY     right   LUMBAR DISC SURGERY     L4   PACEMAKER INSERTION     [employer] Jude [employer] 2210   TUBAL  LIGATION     VESICOVAGINAL FISTULA CLOSURE W/ TAH      OB History   No obstetric history on file.      Home Medications    Prior to Admission medications   Medication Sig Start Date End Date Taking? Authorizing Provider  benzonatate  (TESSALON ) 100 MG capsule Take 1 capsule (100 mg total) by mouth every 8 (eight) hours. 08/18/22   Emil Share, DO  benzonatate  (TESSALON ) 100 MG capsule Take 1 capsule (100 mg total) by mouth every 8 (eight) hours. 08/18/22   Emil Share, DO  doxycycline  (VIBRAMYCIN ) 100 MG capsule Take 1 capsule (100 mg total) by mouth 2 (two) times daily. 08/18/22   Emil Share, DO  Doxylamine Succinate, Sleep, (UNISOM) 25 MG tablet Take 50 mg by mouth at bedtime as needed for sleep.     [provider]  Multiple Vitamins-Minerals (HAIR/SKIN/NAILS PO) Take 2 tablets by mouth daily.    [provider]  nebivolol (BYSTOLIC) 5 MG tablet Take 5 mg by mouth daily.    [provider]  ondansetron  (ZOFRAN -ODT) 4 MG disintegrating tablet Take 1 tablet (4 mg total) by mouth every 4 (four) hours as needed for nausea or vomiting 08/18/22   Emil Share, DO  pantoprazole  (PROTONIX ) 40 MG tablet Take 1 tablet (  40 mg total) by mouth daily. 11/28/14   Ezzard Sonny RAMAN, PA-C  tiZANidine (ZANAFLEX) 4 MG tablet Take 4-8 mg by mouth at bedtime.     [provider]    Family History Family History  Problem Relation Age of Onset   Allergies Maternal Grandmother    Heart disease Other        paternal side   Rheum arthritis Maternal Grandfather    Ovarian cancer Mother        stage IV, survival   Colon cancer Other        paternal aunt, age late 60s/   Lung cancer Neg Hx    Breast cancer Mother     Social History Social History   Tobacco Use   Smoking status: Never   Smokeless tobacco: Never   Tobacco comments:    Never smoked  Substance Use Topics   Alcohol use: Not Currently    Comment: wine on occ   Drug use: No     Allergies   Codeine, Morphine  and codeine, and Penicillins   Review of Systems Review of Systems  Constitutional:  Positive for fatigue.  Musculoskeletal:        Bilateral leg cramps for 1 month secondary to moving from [place]  to [place]   All other systems reviewed and are negative.    Physical Exam Triage Vital Signs ED Triage Vitals  Encounter Vitals Group     BP 10/22/23 1238 (!) 140/97     Girls Systolic BP Percentile --      Girls Diastolic BP Percentile --      Boys Systolic BP Percentile --      Boys Diastolic BP Percentile --      Pulse Rate 10/22/23 1238 94     Resp 10/22/23 1238 19     Temp 10/22/23 1238 99 F (37.2 C)     Temp Source 10/22/23 1238 Oral     SpO2 10/22/23 1238 98 %     Weight --      Height --      Head Circumference --      Peak Flow --      Pain Score 10/22/23 1237 7     Pain Loc --      Pain Education --      Exclude from Growth Chart --    No data found.  Updated Vital Signs BP (!) 140/97   Pulse 94   Temp 99 F (37.2 C) (Oral)   Resp 19   SpO2 98%    Physical Exam Vitals and nursing note reviewed.  Constitutional:      Appearance: Normal appearance. She is obese.  HENT:     Head: Normocephalic and atraumatic.     Mouth/Throat:     Mouth: Mucous membranes are moist.     Pharynx: Oropharynx is clear.  Eyes:     Extraocular Movements: Extraocular movements intact.     Conjunctiva/sclera: Conjunctivae normal.     Pupils: Pupils are equal, round, and reactive to light.  Cardiovascular:     Rate and Rhythm: Normal rate and regular rhythm.     Pulses: Normal pulses.     Heart sounds: Normal heart sounds.  Pulmonary:     Effort: Pulmonary effort is normal.     Breath sounds: Normal breath sounds. No wheezing, rhonchi or rales.  Musculoskeletal:        General: Normal range of motion.     Cervical back: Normal range of  motion and neck supple.  Skin:    General: Skin is warm and dry.  Neurological:     General: No focal deficit present.      Mental Status: She is alert and oriented to person, place, and time. Mental status is at baseline.  Psychiatric:        Mood and Affect: Mood normal.        Behavior: Behavior normal.      UC Treatments / Results  Labs (all labs ordered are listed, but only abnormal results are displayed) Labs Reviewed  POCT URINALYSIS DIP (MANUAL ENTRY) - Abnormal; Notable for the following components:      Result Value   Clarity, UA cloudy (*)    Ketones, POC UA trace (5) (*)    Protein Ur, POC trace (*)    All other components within normal limits    EKG   Radiology No results found.  Procedures Procedures (including critical care time)  Medications Ordered in UC Medications  sodium chloride  0.9 % bolus 1,000 mL (0 mLs Intravenous Stopped 10/22/23 1348)    Initial Impression / Assessment and Plan / UC Course  I have reviewed the triage vital signs and the nursing notes.  Pertinent labs & imaging results that were available during my care of the patient were reviewed by me and considered in my medical decision making (see chart for details).     MDM: 1.  Dehydration-UA revealed above, sodium chloride  0.9% bolus given IV once in clinic; 2.  Fatigue, unspecified type-patient has sick sinus syndrome with status post pacemaker, uncontrolled T2 DM, morbid obesity, and metabolic syndrome moving herself over the past month from [place]  to [place] .  Strongly encourage patient to get movers to do the work for her as her conditioning cannot do this type of daily labor. Encouraged patient to increase daily water  intake to 64 ounces per day 7 days/week.  Advised patient if fatigue worsens and/or unresolved please follow-up with your PCP or here for further evaluation.  Contact information provided for [facility] primary care provider.  Patient discharged home, hemodynamically stable. Final Clinical Impressions(s) / UC Diagnoses   Final diagnoses:  Fatigue, unspecified type  Dehydration      Discharge Instructions      Encouraged patient to increase daily water  intake to 64 ounces per day 7 days/week.  Advised patient if fatigue worsens and/or unresolved please follow-up with your PCP or here for further evaluation.  Contact information provided for [facility] primary care provider.     ED Prescriptions   None    PDMP not reviewed this encounter.   Teddy Sharper, FNP 10/22/23 1408

## 2023-11-18 ENCOUNTER — Encounter (INDEPENDENT_AMBULATORY_CARE_PROVIDER_SITE_OTHER): Payer: Self-pay | Admitting: *Deleted

## 2023-12-06 ENCOUNTER — Ambulatory Visit: Admitting: Urgent Care

## 2024-01-14 ENCOUNTER — Ambulatory Visit: Admitting: Urgent Care

## 2024-01-14 VITALS — BP 164/97 | HR 75 | Temp 98.1°F | Ht 64.0 in | Wt 235.0 lb

## 2024-01-14 DIAGNOSIS — E785 Hyperlipidemia, unspecified: Secondary | ICD-10-CM | POA: Diagnosis not present

## 2024-01-14 DIAGNOSIS — I495 Sick sinus syndrome: Secondary | ICD-10-CM

## 2024-01-14 DIAGNOSIS — E1165 Type 2 diabetes mellitus with hyperglycemia: Secondary | ICD-10-CM

## 2024-01-14 DIAGNOSIS — E1169 Type 2 diabetes mellitus with other specified complication: Secondary | ICD-10-CM | POA: Diagnosis not present

## 2024-01-14 DIAGNOSIS — Z950 Presence of cardiac pacemaker: Secondary | ICD-10-CM

## 2024-01-14 DIAGNOSIS — L719 Rosacea, unspecified: Secondary | ICD-10-CM

## 2024-01-14 DIAGNOSIS — I10 Essential (primary) hypertension: Secondary | ICD-10-CM | POA: Diagnosis not present

## 2024-01-14 DIAGNOSIS — G4739 Other sleep apnea: Secondary | ICD-10-CM | POA: Diagnosis not present

## 2024-01-14 DIAGNOSIS — K449 Diaphragmatic hernia without obstruction or gangrene: Secondary | ICD-10-CM

## 2024-01-14 DIAGNOSIS — M25561 Pain in right knee: Secondary | ICD-10-CM

## 2024-01-14 DIAGNOSIS — N393 Stress incontinence (female) (male): Secondary | ICD-10-CM

## 2024-01-14 DIAGNOSIS — G8929 Other chronic pain: Secondary | ICD-10-CM

## 2024-01-14 DIAGNOSIS — M25562 Pain in left knee: Secondary | ICD-10-CM

## 2024-01-14 MED ORDER — PANTOPRAZOLE SODIUM 40 MG PO TBEC
40.0000 mg | DELAYED_RELEASE_TABLET | Freq: Every day | ORAL | 1 refills | Status: AC
  Filled 2024-04-25: qty 90, 90d supply, fill #0

## 2024-01-14 MED ORDER — BLOOD GLUCOSE TEST VI STRP
ORAL_STRIP | 0 refills | Status: AC
Start: 2024-01-14 — End: ?

## 2024-01-14 MED ORDER — LANCET DEVICE MISC
0 refills | Status: AC
Start: 2024-01-14 — End: ?

## 2024-01-14 MED ORDER — ROSUVASTATIN CALCIUM 40 MG PO TABS: 40.0000 mg | ORAL_TABLET | Freq: Every day | ORAL | 1 refills | Status: DC

## 2024-01-14 MED ORDER — TIRZEPATIDE 2.5 MG/0.5ML ~~LOC~~ SOAJ
2.5000 mg | SUBCUTANEOUS | 2 refills | Status: DC
Start: 2024-01-14 — End: 2024-02-11

## 2024-01-14 MED ORDER — SPIRONOLACTONE 100 MG PO TABS
100.0000 mg | ORAL_TABLET | Freq: Every day | ORAL | 1 refills | Status: DC
  Filled 2024-04-25: qty 90, 90d supply, fill #0

## 2024-01-14 MED ORDER — LOSARTAN POTASSIUM 100 MG PO TABS
100.0000 mg | ORAL_TABLET | Freq: Every day | ORAL | 1 refills | Status: AC
  Filled 2024-04-25: qty 90, 90d supply, fill #0

## 2024-01-14 MED ORDER — LANCETS MISC. MISC
0 refills | Status: AC
Start: 2024-01-14 — End: ?

## 2024-01-14 MED ORDER — BLOOD GLUCOSE MONITORING SUPPL DEVI
0 refills | Status: AC
Start: 2024-01-14 — End: ?

## 2024-01-14 MED ORDER — CELECOXIB 200 MG PO CAPS
200.0000 mg | ORAL_CAPSULE | Freq: Two times a day (BID) | ORAL | 2 refills | Status: AC
Start: 2024-01-14 — End: ?

## 2024-01-14 NOTE — Progress Notes (Signed)
 New Patient Office Visit  Subjective:  Patient ID: Marilyn Ortiz, female    DOB: 11/17/1960  Age: 63 y.o. MRN: [id number]  CC:  Chief Complaint  Patient presents with   Establish Care    HPI Marilyn Ortiz presents to establish care.  Discussed the use of AI scribe software for clinical note transcription with the patient, who gave verbal consent to proceed.  History of Present Illness   Marilyn Ortiz is a 63 year old female who presents for medication review and management of her chronic conditions.  She is not currently on any prescriptions due to her previous doctor advising her to stop them. She had been on multiple medications but was advised to stop them when she went for knee injections.  She has a history of sick sinus syndrome for which she has a pacemaker. She has not yet established care with a cardiologist in her current location but plans to return to the practice where her pacemaker was placed. (Dr. Fernande)  She has a history of high cholesterol and was previously on Crestor 40 mg.  She has a history of gout, experiencing episodes one to two times a year, and notes that alcohol tends to trigger these episodes, so she has stopped drinking. She was previously taking allopurinol but reports taking it only when she feels a flare-up starting.  She has a history of diabetes, with a last recorded A1c of 9.5 in April 2025. She was previously on metformin but stopped due to severe gastrointestinal side effects. She has used injectable medications for diabetes in the past, which she tolerated after initial nausea.  She reports a history of pneumonia and was on an inhaler at that time but does not have chronic asthma and does not feel the need for an inhaler currently.  She has a history of heartburn and was previously on pantoprazole . She does not often experience heartburn now but was previously on the medication to manage stomach acid.  She was previously on gabapentin for  shingles-related nerve pain but reports that it made her feel like a 'zombie' and affected her cognitive function. She does not wish to continue this medication as she is not experiencing neuropathic pain.  She has a history of sleep apnea, which she continues to treat with a CPAP machine.  She experiences stress incontinence, noting urgency when standing up, and attributes this to a sling procedure done 30 years ago. She also reports joint pain, particularly in her knees, which affects her sleep.       Outpatient Encounter Medications as of 01/14/2024  Medication Sig   Blood Glucose Monitoring Suppl DEVI For twice daily glucose monitoring. Any device covered by insurance.   celecoxib (CELEBREX) 200 MG capsule Take 1 capsule (200 mg total) by mouth 2 (two) times daily with a meal. One to 2 tablets by mouth daily as needed for pain.   Glucose Blood (BLOOD GLUCOSE TEST STRIPS) STRP For twice daily glucose testing. May substitute to any manufacturer covered by patient's insurance.   Lancet Device MISC For twice daily testing. May substitute to any manufacturer covered by patient's insurance.   Lancets Misc. MISC For twice daily testing. May substitute to any manufacturer covered by patient's insurance.   losartan (COZAAR) 100 MG tablet Take 1 tablet (100 mg total) by mouth daily.   rosuvastatin (CRESTOR) 40 MG tablet Take 1 tablet (40 mg total) by mouth daily.   spironolactone (ALDACTONE) 100 MG tablet Take 1 tablet (100  mg total) by mouth daily.   tirzepatide (MOUNJARO) 2.5 MG/0.5ML Pen Inject 2.5 mg into the skin once a week.   Doxylamine Succinate, Sleep, (UNISOM) 25 MG tablet Take 50 mg by mouth at bedtime as needed for sleep.    Multiple Vitamins-Minerals (HAIR/SKIN/NAILS PO) Take 2 tablets by mouth daily.   ondansetron  (ZOFRAN -ODT) 4 MG disintegrating tablet Take 1 tablet (4 mg total) by mouth every 4 (four) hours as needed for nausea or vomiting   pantoprazole  (PROTONIX ) 40 MG tablet Take 1  tablet (40 mg total) by mouth daily.   [DISCONTINUED] benzonatate  (TESSALON ) 100 MG capsule Take 1 capsule (100 mg total) by mouth every 8 (eight) hours.   [DISCONTINUED] benzonatate  (TESSALON ) 100 MG capsule Take 1 capsule (100 mg total) by mouth every 8 (eight) hours.   [DISCONTINUED] doxycycline  (VIBRAMYCIN ) 100 MG capsule Take 1 capsule (100 mg total) by mouth 2 (two) times daily.   [DISCONTINUED] nebivolol (BYSTOLIC) 5 MG tablet Take 5 mg by mouth daily.   [DISCONTINUED] pantoprazole  (PROTONIX ) 40 MG tablet Take 1 tablet (40 mg total) by mouth daily.   [DISCONTINUED] tiZANidine (ZANAFLEX) 4 MG tablet Take 4-8 mg by mouth at bedtime.    No facility-administered encounter medications on file as of 01/14/2024.    Past Medical History:  Diagnosis Date   Chronic headaches    Deglutition syncope    Dilation of esophagus    Hemorrhoids    HTN (hypertension)    Hyperlipidemia    Hyperplastic colon polyp    OSA (obstructive sleep apnea)    Skin cancer (melanoma) (HCC) ? 2002   Back    Past Surgical History:  Procedure Laterality Date   ABDOMINAL HYSTERECTOMY     BLADDER SUSPENSION     CESAREAN SECTION     x 2   COLONOSCOPY     normal per patient. Around 2010 in [place].   COLONOSCOPY N/A 12/27/2013   Dr.Rourk- anal canal hemorrhoids- single rectosigmoid polyp. 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 bx= hyperplastic polyp.   ESOPHAGEAL DILATION     ESOPHAGOGASTRODUODENOSCOPY  03/20/2014   RMR: Schazki's ring status-post dilation as described above. Hiatal hernia.    KNEE SURGERY     right   LUMBAR DISC SURGERY     L4   PACEMAKER INSERTION     St. Jude Accent 2210   TUBAL LIGATION     VESICOVAGINAL FISTULA CLOSURE W/ TAH      Family History  Problem Relation Age of Onset   Allergies Maternal Grandmother    Heart disease Other        paternal side   Rheum arthritis Maternal Grandfather    Ovarian cancer Mother        stage IV, survival   Colon cancer Other         paternal aunt, age late 60s/   Lung cancer Neg Hx    Breast cancer Mother     Social History   Socioeconomic History   Marital status: Married    Spouse name: Catia Todorov   Number of children: 3   Years of education: Not on file   Highest education level: Not on file  Occupational History   Occupation: Childcare    Comment: Full-time  Tobacco Use   Smoking status: Never   Smokeless tobacco: Never   Tobacco comments:    Never smoked  Substance and Sexual Activity   Alcohol use: Not Currently    Comment: wine on occ   Drug use:  No   Sexual activity: Not on file  Other Topics Concern   Not on file  Social History Narrative   Not on file   Social Drivers of Health   Financial Resource Strain: Not on file  Food Insecurity: Not on file  Transportation Needs: Not on file  Physical Activity: Not on file  Stress: Not on file  Social Connections: Not on file  Intimate Partner Violence: Not on file    ROS: as noted in HPI  Objective:  BP (!) 164/97 (BP Location: Left Arm, Patient Position: Sitting, Cuff Size: Large)   Pulse 75   Temp 98.1 F (36.7 C) (Oral)   Ht 5' 4 (1.626 m)   Wt 235 lb (106.6 kg)   SpO2 98%   BMI 40.34 kg/m   Physical Exam Vitals and nursing note reviewed.  Constitutional:      General: She is not in acute distress.    Appearance: Normal appearance. She is not ill-appearing, toxic-appearing or diaphoretic.  HENT:     Head: Normocephalic and atraumatic.     Right Ear: Tympanic membrane, ear canal and external ear normal. There is no impacted cerumen.     Left Ear: Tympanic membrane, ear canal and external ear normal. There is no impacted cerumen.     Nose: Nose normal.     Mouth/Throat:     Mouth: Mucous membranes are moist.     Pharynx: Oropharynx is clear. No oropharyngeal exudate or posterior oropharyngeal erythema.  Eyes:     General: No scleral icterus.       Right eye: No discharge.        Left eye: No discharge.     Extraocular  Movements: Extraocular movements intact.     Pupils: Pupils are equal, round, and reactive to light.  Neck:     Thyroid: No thyroid mass, thyromegaly or thyroid tenderness.  Cardiovascular:     Rate and Rhythm: Normal rate and regular rhythm.     Pulses: Normal pulses.  Pulmonary:     Effort: Pulmonary effort is normal. No respiratory distress.     Breath sounds: Normal breath sounds. No stridor. No wheezing or rhonchi.  Abdominal:     General: Abdomen is flat. Bowel sounds are normal. There is no distension.     Palpations: Abdomen is soft. There is no mass.     Tenderness: There is no abdominal tenderness. There is no guarding.  Musculoskeletal:     Cervical back: Normal range of motion and neck supple. No rigidity or tenderness.     Right lower leg: No edema.     Left lower leg: No edema.  Lymphadenopathy:     Cervical: No cervical adenopathy.  Skin:    General: Skin is warm and dry.     Coloration: Skin is not jaundiced.     Findings: No bruising, erythema or rash.  Neurological:     General: No focal deficit present.     Mental Status: She is alert and oriented to person, place, and time.     Sensory: No sensory deficit.     Motor: No weakness.  Psychiatric:        Mood and Affect: Mood normal.        Behavior: Behavior normal.     Assessment & Plan:  HYPERTENSION, BENIGN -     Losartan Potassium; Take 1 tablet (100 mg total) by mouth daily.  Dispense: 90 tablet; Refill: 1 -     CMP14+EGFR -  CBC with Differential/Platelet -     TSH  Sick sinus syndrome (HCC) -     Ambulatory referral to Cardiology  Other sleep apnea  Hyperlipidemia associated with type 2 diabetes mellitus (HCC) -     Rosuvastatin Calcium; Take 1 tablet (40 mg total) by mouth daily.  Dispense: 90 tablet; Refill: 1 -     Lipid panel  Type 2 diabetes mellitus with hyperglycemia, without long-term current use of insulin (HCC) -     Tirzepatide; Inject 2.5 mg into the skin once a week.   Dispense: 2 mL; Refill: 2 -     Blood Glucose Monitoring Suppl; For twice daily glucose monitoring. Any device covered by insurance.  Dispense: 1 each; Refill: 0 -     Blood Glucose Test; For twice daily glucose testing. May substitute to any manufacturer covered by patient's insurance.  Dispense: 100 strip; Refill: 0 -     Lancet Device; For twice daily testing. May substitute to any manufacturer covered by patient's insurance.  Dispense: 1 each; Refill: 0 -     Lancets Misc.; For twice daily testing. May substitute to any manufacturer covered by patient's insurance.  Dispense: 100 each; Refill: 0 -     Hemoglobin A1c  Rosacea -     Spironolactone; Take 1 tablet (100 mg total) by mouth daily.  Dispense: 90 tablet; Refill: 1  Presence of cardiac pacemaker -     Ambulatory referral to Cardiology  Stress incontinence  Chronic pain of both knees -     Celecoxib; Take 1 capsule (200 mg total) by mouth 2 (two) times daily with a meal. One to 2 tablets by mouth daily as needed for pain.  Dispense: 60 capsule; Refill: 2  Hiatal hernia -     Pantoprazole  Sodium; Take 1 tablet (40 mg total) by mouth daily.  Dispense: 90 tablet; Refill: 1  Assessment and Plan    Type 2 diabetes mellitus Poor glycemic control with A1c previously at 9.5%. Adverse reactions to metformin. Benefits of tirzepatide discussed, including cardioprotection and weight loss. - Order A1c test. - Initiate tirzepatide (Mounjaro) at 2.5 mg once weekly, titrate based on tolerance and glycemic response. - Document adverse reaction to metformin for insurance coverage. - Educate on tirzepatide side effects and importance of exercise.  Obesity Interest in weight loss. Benefits of tirzepatide for weight loss discussed. - Initiate tirzepatide (Mounjaro) for weight loss. - Encourage regular exercise.  Hypertension Current blood pressure 164/97 mmHg. Previously controlled on losartan, hydrochlorothiazide , and spironolactone.  Discussed spironolactone and losartan interaction. - Restart losartan 100 mg daily. - Monitor blood pressure response to ARB alone before considering hydrochlorothiazide  given concurrent stress incontinence.  Hyperlipidemia Managed with Crestor 40 mg. No issues reported. - Continue Crestor 40 mg daily.  Gout Infrequent flares. Educated on proper allopurinol use (prevention not abortant) and colchicine for acute flares. - Order uric acid level.  Sick sinus syndrome, status post pacemaker Managed with pacemaker. - Facilitate cardiologist referral for follow-up.  Obstructive sleep apnea, on CPAP Managed with CPAP.  Hiatal hernia and gastroesophageal reflux disease (GERD) Previously managed with pantoprazole . No symptoms of heartburn reported. - Restart pantoprazole  20 mg daily.  Rosacea Improvement with spironolactone, which also aids hair growth on chin. Discussed interaction with ARB, will need very close monitoring of K levels. Pt states has been on in the past with no issues. Consider topical metrogel as a safer alternative. - Restart spironolactone 100 mg daily. - recheck potassium levels in 2 weeks, DC  if elevated  Stress urinary incontinence, status post sling Urgency upon standing, possibly related to weight gain and aging sling. - Will avoid restarting hydrochlorothiazide  to see if this helps with prevention of urinary issues  Chronic bilateral knee pain Exacerbated at night, affecting sleep. No kidney issues, allowing NSAID use. - Initiate Celebrex twice daily with meals for one month.  General Health Maintenance Difficulty with current glucometer. - Provide new glucometer and supplies through insurance. - Educate on glucometer calibration and use.      Total time spent including face to face time, chart review, lab interpretation, documentation and extensive dietary/ exercise counseling including lifestyle modifications was 60 minutes    Return in about 4 weeks  (around 02/11/2024).   Benton LITTIE Gave, PA

## 2024-01-14 NOTE — Patient Instructions (Addendum)
 Start mounjaro 2.5mg  injection once weekly x 4 weeks. Restart losartan 100mg  Restart spironolactone. Restart crestor Restart protonix .  For your knee pain - please start taking celebrex 1-2x /day with food.  You must return for a potassium recheck in 2 weeks. If elevated, you will need to discontinue spironolactone.  Please check your glucose at least once a day and write it down for me: Fasting goal: <120 1 hour after eating: <180 2 hours after eating: <140  I placed a referral to see Dr. Fernande, your previous cardiologist.  Please return in 4 weeks for in office follow up.

## 2024-01-15 LAB — CBC WITH DIFFERENTIAL/PLATELET
Basophils Absolute: 0 x10E3/uL (ref 0.0–0.2)
Basos: 1 %
EOS (ABSOLUTE): 0.2 x10E3/uL (ref 0.0–0.4)
Eos: 3 %
Hematocrit: 41.9 % (ref 34.0–46.6)
Hemoglobin: 13.2 g/dL (ref 11.1–15.9)
Immature Grans (Abs): 0 x10E3/uL (ref 0.0–0.1)
Immature Granulocytes: 0 %
Lymphocytes Absolute: 1.4 x10E3/uL (ref 0.7–3.1)
Lymphs: 25 %
MCH: 28.8 pg (ref 26.6–33.0)
MCHC: 31.5 g/dL (ref 31.5–35.7)
MCV: 91 fL (ref 79–97)
Monocytes Absolute: 0.5 x10E3/uL (ref 0.1–0.9)
Monocytes: 9 %
Neutrophils Absolute: 3.6 x10E3/uL (ref 1.4–7.0)
Neutrophils: 62 %
Platelets: 211 x10E3/uL (ref 150–450)
RBC: 4.59 x10E6/uL (ref 3.77–5.28)
RDW: 13.7 % (ref 11.7–15.4)
WBC: 5.7 x10E3/uL (ref 3.4–10.8)

## 2024-01-15 LAB — HEMOGLOBIN A1C
Est. average glucose Bld gHb Est-mCnc: 154 mg/dL
Hgb A1c MFr Bld: 7 % — ABNORMAL HIGH (ref 4.8–5.6)

## 2024-01-15 LAB — CMP14+EGFR
ALT: 11 IU/L (ref 0–32)
AST: 13 IU/L (ref 0–40)
Albumin: 3.9 g/dL (ref 3.9–4.9)
Alkaline Phosphatase: 95 IU/L (ref 49–135)
BUN/Creatinine Ratio: 14 (ref 12–28)
BUN: 10 mg/dL (ref 8–27)
Bilirubin Total: 0.7 mg/dL (ref 0.0–1.2)
CO2: 21 mmol/L (ref 20–29)
Calcium: 9.5 mg/dL (ref 8.7–10.3)
Chloride: 105 mmol/L (ref 96–106)
Creatinine, Ser: 0.72 mg/dL (ref 0.57–1.00)
Globulin, Total: 2.1 g/dL (ref 1.5–4.5)
Glucose: 124 mg/dL — ABNORMAL HIGH (ref 70–99)
Potassium: 3.8 mmol/L (ref 3.5–5.2)
Sodium: 142 mmol/L (ref 134–144)
Total Protein: 6 g/dL (ref 6.0–8.5)
eGFR: 93 mL/min/1.73 (ref >59)

## 2024-01-15 LAB — LIPID PANEL
Chol/HDL Ratio: 6.1 ratio — ABNORMAL HIGH (ref 0.0–4.4)
Cholesterol, Total: 208 mg/dL — ABNORMAL HIGH (ref 100–199)
HDL: 34 mg/dL — ABNORMAL LOW (ref >39)
LDL Chol Calc (NIH): 136 mg/dL — ABNORMAL HIGH (ref 0–99)
Triglycerides: 213 mg/dL — ABNORMAL HIGH (ref 0–149)
VLDL Cholesterol Cal: 38 mg/dL (ref 5–40)

## 2024-01-15 LAB — TSH: TSH: 1.77 u[IU]/mL (ref 0.450–4.500)

## 2024-01-16 ENCOUNTER — Encounter: Payer: Self-pay | Admitting: Urgent Care

## 2024-01-16 ENCOUNTER — Ambulatory Visit: Payer: Self-pay | Admitting: Urgent Care

## 2024-01-16 DIAGNOSIS — L719 Rosacea, unspecified: Secondary | ICD-10-CM | POA: Insufficient documentation

## 2024-01-16 DIAGNOSIS — E1165 Type 2 diabetes mellitus with hyperglycemia: Secondary | ICD-10-CM | POA: Insufficient documentation

## 2024-01-16 DIAGNOSIS — N393 Stress incontinence (female) (male): Secondary | ICD-10-CM | POA: Insufficient documentation

## 2024-01-16 DIAGNOSIS — E1169 Type 2 diabetes mellitus with other specified complication: Secondary | ICD-10-CM | POA: Insufficient documentation

## 2024-01-16 DIAGNOSIS — E782 Mixed hyperlipidemia: Secondary | ICD-10-CM | POA: Insufficient documentation

## 2024-01-20 ENCOUNTER — Telehealth: Payer: Self-pay | Admitting: Pharmacy Technician

## 2024-01-20 ENCOUNTER — Other Ambulatory Visit (HOSPITAL_COMMUNITY): Payer: Self-pay

## 2024-01-20 NOTE — Telephone Encounter (Signed)
 Pharmacy Patient Advocate Encounter   Received notification from Onbase that prior authorization for Mounjaro 2.5MG /0.5ML auto-injectors is required/requested.   Insurance verification completed.   The patient is insured through [facility] MEDICARE   Per test claim: PA required; PA submitted to above mentioned insurance via Latent Key/confirmation #/EOC [id number] Status is pending

## 2024-01-21 ENCOUNTER — Other Ambulatory Visit (HOSPITAL_COMMUNITY): Payer: Self-pay

## 2024-01-21 NOTE — Telephone Encounter (Signed)
 Copied from CRM [id number]. Topic: Clinical - Medication Prior Auth >> Jan 20, 2024  5:16 PM Delon DASEN wrote: Reason for CRM: Jermera with [employer]  tirzepatide (MOUNJARO) 2.5 MG/0.5ML Pen- approved starting 01/20/24 for a full year 01/19/25. Sending fax

## 2024-01-21 NOTE — Telephone Encounter (Signed)
 Pharmacy Patient Advocate Encounter  Received notification from [facility] MEDICARE that Prior Authorization for Mounjaro 2.5MG /0.5ML auto-injectors has been APPROVED from 01/20/2024 to 01/19/2025. Ran test claim, Copay is $45.00. This test claim was processed through [facility]- copay amounts may vary at other pharmacies due to pharmacy/plan contracts, or as the patient moves through the different stages of their insurance plan.   PA #/Case ID/Reference #: [id number]

## 2024-01-28 ENCOUNTER — Ambulatory Visit

## 2024-01-28 ENCOUNTER — Other Ambulatory Visit: Payer: Self-pay | Admitting: Urgent Care

## 2024-01-28 DIAGNOSIS — Z79899 Other long term (current) drug therapy: Secondary | ICD-10-CM

## 2024-01-28 DIAGNOSIS — I10 Essential (primary) hypertension: Secondary | ICD-10-CM | POA: Diagnosis not present

## 2024-01-29 LAB — POTASSIUM: Potassium: 4 mmol/L (ref 3.5–5.2)

## 2024-01-30 ENCOUNTER — Ambulatory Visit: Payer: Self-pay | Admitting: Urgent Care

## 2024-02-11 ENCOUNTER — Ambulatory Visit: Admitting: Urgent Care

## 2024-02-11 VITALS — BP 143/87 | HR 84 | Ht 64.0 in | Wt 231.0 lb

## 2024-02-11 DIAGNOSIS — Z13820 Encounter for screening for osteoporosis: Secondary | ICD-10-CM

## 2024-02-11 DIAGNOSIS — Z114 Encounter for screening for human immunodeficiency virus [HIV]: Secondary | ICD-10-CM

## 2024-02-11 DIAGNOSIS — R252 Cramp and spasm: Secondary | ICD-10-CM

## 2024-02-11 DIAGNOSIS — E1165 Type 2 diabetes mellitus with hyperglycemia: Secondary | ICD-10-CM | POA: Diagnosis not present

## 2024-02-11 DIAGNOSIS — Z23 Encounter for immunization: Secondary | ICD-10-CM | POA: Diagnosis not present

## 2024-02-11 DIAGNOSIS — N6459 Other signs and symptoms in breast: Secondary | ICD-10-CM

## 2024-02-11 DIAGNOSIS — I10 Essential (primary) hypertension: Secondary | ICD-10-CM | POA: Diagnosis not present

## 2024-02-11 DIAGNOSIS — Z1159 Encounter for screening for other viral diseases: Secondary | ICD-10-CM

## 2024-02-11 DIAGNOSIS — Z1231 Encounter for screening mammogram for malignant neoplasm of breast: Secondary | ICD-10-CM

## 2024-02-11 MED ORDER — HYDROCHLOROTHIAZIDE 12.5 MG PO TABS: 12.5000 mg | ORAL_TABLET | Freq: Every day | ORAL | 1 refills | Status: DC

## 2024-02-11 MED ORDER — TIRZEPATIDE 5 MG/0.5ML ~~LOC~~ SOAJ
5.0000 mg | SUBCUTANEOUS | 2 refills | Status: AC
  Filled 2024-04-25 (×2): qty 6, 84d supply, fill #0
  Filled 2024-04-25: qty 2, 28d supply, fill #0
  Filled 2024-05-02: qty 6, 84d supply, fill #0

## 2024-02-11 NOTE — Progress Notes (Signed)
 Established Patient Office Visit  Subjective:  Patient ID: Marilyn Ortiz, female    DOB: 02-19-1961  Age: 63 y.o. MRN: [id number]  Chief Complaint  Patient presents with   Follow-up    HPI  Discussed the use of AI scribe software for clinical note transcription with the patient, who gave verbal consent to proceed.  History of Present Illness   Marilyn Ortiz is a 63 year old female with hypertension who presents for follow-up and medication management.  She is currently taking tirzepatide 2.5 mg weekly and tolerates it well without nausea, vomiting, gastrointestinal upset, or abdominal pain. Her medication regimen also includes losartan and spironolactone. Her home blood pressure readings are around 131/79 or 80 mmHg, checked about a week ago. K levels checked and were WNL. She is on spironolactone per dermatology due to skin and hair concerns, and would like to continue taking as she feels it has been effective. Pt used to be on hydrochlorothiazide  from her previous PCP.  She continues to take Crestor for hyperlipidemia management. She experiences leg cramps, described as 'bad Charlie horses,' occurring in her bilateral calves at night and waking her from sleep. These cramps have been present for about a year and pre-dated the initiation of crestor.   We did discuss preventive measures today. Pt would like to update shingles vaccine.  She is due for mammogram (had one in 2024 showing chronic R breast abnormality with no change in 2 years). Will also order first bone density test.     Patient Active Problem List   Diagnosis Date Noted   Hyperlipidemia associated with type 2 diabetes mellitus (HCC) 01/16/2024   Type 2 diabetes mellitus with hyperglycemia, without long-term current use of insulin (HCC) 01/16/2024   Rosacea 01/16/2024   Stress incontinence 01/16/2024   Hiatal hernia    Schatzki's ring    Rectal bleeding 09/18/2013   Anemia 09/18/2013   Sinus tachycardia-relative  08/11/2012   Sick sinus syndrome (HCC) 07/24/2011   Deglutition syncope 07/22/2010   Pacemaker-St. Jude's 07/22/2010   Fatigue 07/22/2010   HYPERTENSION, BENIGN 02/19/2009   Sleep apnea 02/19/2009   Past Medical History:  Diagnosis Date   Chronic headaches    Deglutition syncope    Dilation of esophagus    Hemorrhoids    HTN (hypertension)    Hyperlipidemia    Hyperplastic colon polyp    OSA (obstructive sleep apnea)    Skin cancer (melanoma) (HCC) ? 2002   Back   Past Surgical History:  Procedure Laterality Date   ABDOMINAL HYSTERECTOMY     BLADDER SUSPENSION     CESAREAN SECTION     x 2   COLONOSCOPY     normal per patient. Around 2010 in [place].   COLONOSCOPY N/A 12/27/2013   Dr.Rourk- anal canal hemorrhoids- single rectosigmoid polyp. 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 bx= hyperplastic polyp.   ESOPHAGEAL DILATION     ESOPHAGOGASTRODUODENOSCOPY  03/20/2014   RMR: Schazki's ring status-post dilation as described above. Hiatal hernia.    KNEE SURGERY     right   LUMBAR DISC SURGERY     L4   PACEMAKER INSERTION     St. Jude Accent 2210   TUBAL LIGATION     VESICOVAGINAL FISTULA CLOSURE W/ TAH     Social History   Tobacco Use   Smoking status: Never   Smokeless tobacco: Never   Tobacco comments:    Never smoked  Substance Use Topics   Alcohol use: Not  Currently    Comment: wine on occ   Drug use: No      ROS: as noted in HPI  Objective:     BP (!) 143/87   Pulse 84   Ht 5' 4 (1.626 m)   Wt 231 lb (104.8 kg)   SpO2 95%   BMI 39.65 kg/m  BP Readings from Last 3 Encounters:  02/11/24 (!) 143/87  01/14/24 (!) 164/97  10/22/23 (!) 140/97   Wt Readings from Last 3 Encounters:  02/11/24 231 lb (104.8 kg)  01/14/24 235 lb (106.6 kg)  08/18/22 223 lb 8.7 oz (101.4 kg)      Physical Exam Vitals and nursing note reviewed.  Constitutional:      General: She is not in acute distress.    Appearance: Normal appearance. She is not  ill-appearing, toxic-appearing or diaphoretic.  HENT:     Head: Normocephalic and atraumatic.     Right Ear: Tympanic membrane, ear canal and external ear normal. There is no impacted cerumen.     Left Ear: Tympanic membrane, ear canal and external ear normal. There is no impacted cerumen.     Nose: Nose normal.     Mouth/Throat:     Mouth: Mucous membranes are moist.     Pharynx: Oropharynx is clear. No oropharyngeal exudate or posterior oropharyngeal erythema.  Eyes:     General: No scleral icterus.       Right eye: No discharge.        Left eye: No discharge.     Extraocular Movements: Extraocular movements intact.     Pupils: Pupils are equal, round, and reactive to light.  Neck:     Thyroid: No thyroid mass, thyromegaly or thyroid tenderness.  Cardiovascular:     Rate and Rhythm: Normal rate and regular rhythm.     Pulses: Normal pulses.  Pulmonary:     Effort: Pulmonary effort is normal. No respiratory distress.     Breath sounds: Normal breath sounds. No stridor. No wheezing or rhonchi.  Musculoskeletal:     Cervical back: Normal range of motion and neck supple. No rigidity or tenderness.     Right lower leg: No edema.     Left lower leg: No edema.  Lymphadenopathy:     Cervical: No cervical adenopathy.  Skin:    General: Skin is warm and dry.     Coloration: Skin is not jaundiced.     Findings: No bruising, erythema or rash.  Neurological:     General: No focal deficit present.     Mental Status: She is alert and oriented to person, place, and time.     Sensory: No sensory deficit.     Motor: No weakness.  Psychiatric:        Mood and Affect: Mood normal.        Behavior: Behavior normal.      No results found for any visits on 02/11/24.  Last CBC Lab Results  Component Value Date   WBC 5.7 01/14/2024   HGB 13.2 01/14/2024   HCT 41.9 01/14/2024   MCV 91 01/14/2024   MCH 28.8 01/14/2024   RDW 13.7 01/14/2024   PLT 211 01/14/2024   Last metabolic  panel Lab Results  Component Value Date   GLUCOSE 124 (H) 01/14/2024   NA 142 01/14/2024   K 4.0 01/28/2024   CL 105 01/14/2024   CO2 21 01/14/2024   BUN 10 01/14/2024   CREATININE 0.72 01/14/2024   EGFR 93 01/14/2024  CALCIUM 9.5 01/14/2024   PROT 6.0 01/14/2024   ALBUMIN 3.9 01/14/2024   LABGLOB 2.1 01/14/2024   BILITOT 0.7 01/14/2024   ALKPHOS 95 01/14/2024   AST 13 01/14/2024   ALT 11 01/14/2024   Last lipids Lab Results  Component Value Date   CHOL 208 (H) 01/14/2024   HDL 34 (L) 01/14/2024   LDLCALC 136 (H) 01/14/2024   TRIG 213 (H) 01/14/2024   CHOLHDL 6.1 (H) 01/14/2024   Last hemoglobin A1c Lab Results  Component Value Date   HGBA1C 7.0 (H) 01/14/2024   Last thyroid functions Lab Results  Component Value Date   TSH 1.770 01/14/2024   Last vitamin D No results found for: 25OHVITD2, 25OHVITD3, VD25OH Last vitamin B12 and Folate No results found for: VITAMINB12, FOLATE    The 10-year ASCVD risk score (Arnett DK, et al., 2019) is: 21%  Assessment & Plan:  Type 2 diabetes mellitus with hyperglycemia, without long-term current use of insulin (HCC) -     CMP14+EGFR -     Magnesium -     CK -     Tirzepatide; Inject 5 mg into the skin once a week.  Dispense: 6 mL; Refill: 2 -     Ambulatory referral to Ophthalmology  HYPERTENSION, BENIGN -     hydroCHLOROthiazide ; Take 1 tablet (12.5 mg total) by mouth daily.  Dispense: 90 tablet; Refill: 1  Screening mammogram, encounter for -     3D Screening Mammogram, Left and Right; Future  Need for shingles vaccine -     Varicella-zoster vaccine IM  Screening for osteoporosis -     DG Bone Density; Future  Muscle cramps -     CMP14+EGFR -     Magnesium -     CK  Abnormal breast tissue Stable hypoechoic mass in the right breast at 12:00 6 cm from the nipple measuring 1.1 x 0.3 x 0.4 cm. This has been stable for 2 years. The patient can return to getting annual screening mammograms     Assessment and Plan    Hypertension Blood pressure slightly elevated in office, more controlled at home but still on upper limits of normal.  - Monitor blood pressure at home - Continue losartan, but add hydrochlorothiazide  again  Bilateral calf cramps Severe nocturnal cramps for a year. Etiology not discussed. - Consider electrolyte imbalance or medication side effects. - Recommend pre-bed stretching exercises. - labs as above  Hyperlipidemia Continues Crestor with stable management. - Continue Crestor. - Monitor lipid levels at follow up  Type 2 DM Increase mounjaro to 5mg  weekly. Pt tolerating well. A1C 7.0% on 01/14/24.  - referral placed for DM eye exam - recheck A1C in 3 months - urine microalbumin completed on 07/15/23  Preventive measures - updated 1st shingles vaccine today - discussed Tdap and pneumonia, both declined - pap smears no longer required, pt had complete hysterectomy - mammogram and dexa scan ordered         Return in about 3 months (around 05/13/2024).   Benton LITTIE Gave, PA

## 2024-02-11 NOTE — Patient Instructions (Addendum)
 Marilyn Ortiz Sage [address] [place] [address] [phone]  Please call the above number for your heart doctor.  Schedule mammogram and dexa scan [facility] Address: [address], [place], [place] [address] Phone: [phone]   Continue all meds as ordered, but increase mounjaro to 5mg  weekly and add hydrochlorothiazide  12.5mg   Return in 3 months for office visit  Return in 2 months for shingles vaccine

## 2024-02-12 ENCOUNTER — Encounter: Payer: Self-pay | Admitting: Urgent Care

## 2024-02-12 LAB — CMP14+EGFR
ALT: 13 IU/L (ref 0–32)
AST: 17 IU/L (ref 0–40)
Albumin: 4.3 g/dL (ref 3.9–4.9)
Alkaline Phosphatase: 82 IU/L (ref 49–135)
BUN/Creatinine Ratio: 13 (ref 12–28)
BUN: 11 mg/dL (ref 8–27)
Bilirubin Total: 0.6 mg/dL (ref 0.0–1.2)
CO2: 21 mmol/L (ref 20–29)
Calcium: 9.7 mg/dL (ref 8.7–10.3)
Chloride: 105 mmol/L (ref 96–106)
Creatinine, Ser: 0.84 mg/dL (ref 0.57–1.00)
Globulin, Total: 2.5 g/dL (ref 1.5–4.5)
Glucose: 73 mg/dL (ref 70–99)
Potassium: 3.5 mmol/L (ref 3.5–5.2)
Sodium: 143 mmol/L (ref 134–144)
Total Protein: 6.8 g/dL (ref 6.0–8.5)
eGFR: 77 mL/min/1.73 (ref >59)

## 2024-02-12 LAB — MAGNESIUM: Magnesium: 2.1 mg/dL (ref 1.6–2.3)

## 2024-02-12 LAB — CK: Total CK: 105 U/L (ref 32–182)

## 2024-02-14 ENCOUNTER — Ambulatory Visit: Payer: Self-pay | Admitting: Urgent Care

## 2024-03-02 ENCOUNTER — Ambulatory Visit
Admission: RE | Admit: 2024-03-02 | Discharge: 2024-03-02 | Disposition: A | Source: Ambulatory Visit | Attending: Urgent Care | Admitting: Urgent Care

## 2024-03-02 DIAGNOSIS — Z1231 Encounter for screening mammogram for malignant neoplasm of breast: Secondary | ICD-10-CM

## 2024-03-15 ENCOUNTER — Encounter: Payer: Self-pay | Admitting: Cardiology

## 2024-03-15 ENCOUNTER — Ambulatory Visit: Attending: Cardiology | Admitting: Cardiology

## 2024-03-15 VITALS — BP 116/76 | HR 97 | Ht 64.0 in | Wt 227.4 lb

## 2024-03-15 DIAGNOSIS — Z950 Presence of cardiac pacemaker: Secondary | ICD-10-CM

## 2024-03-15 DIAGNOSIS — I10 Essential (primary) hypertension: Secondary | ICD-10-CM

## 2024-03-15 DIAGNOSIS — E782 Mixed hyperlipidemia: Secondary | ICD-10-CM | POA: Diagnosis not present

## 2024-03-15 DIAGNOSIS — E1165 Type 2 diabetes mellitus with hyperglycemia: Secondary | ICD-10-CM

## 2024-03-15 DIAGNOSIS — I495 Sick sinus syndrome: Secondary | ICD-10-CM

## 2024-03-15 DIAGNOSIS — R0609 Other forms of dyspnea: Secondary | ICD-10-CM | POA: Diagnosis not present

## 2024-03-15 NOTE — Patient Instructions (Signed)
  Lab Work: Fasting lipid panel   If you have labs (blood work) drawn today and your tests are completely normal, you will receive your results only by: MyChart Message (if you have MyChart) OR A paper copy in the mail If you have any lab test that is abnormal or we need to change your treatment, we will call you to review the results.  Testing/Procedures: Echocardiogram  Your physician has requested that you have an echocardiogram. Echocardiography is a painless test that uses sound waves to create images of your heart. It provides your doctor with information about the size and shape of your heart and how well your heart's chambers and valves are working. This procedure takes approximately one hour. There are no restrictions for this procedure. Please do NOT wear cologne, perfume, aftershave, or lotions (deodorant is allowed). Please arrive 15 minutes prior to your appointment time.  Please note: We ask at that you not bring children with you during ultrasound (echo/ vascular) testing. Due to room size and safety concerns, children are not allowed in the ultrasound rooms during exams. Our front office staff cannot provide observation of children in our lobby area while testing is being conducted. An adult accompanying a patient to their appointment will only be allowed in the ultrasound room at the discretion of the ultrasound technician under special circumstances. We apologize for any inconvenience.  Exercise Nuclear Stress Test  Your physician has requested that you have en exercise stress myoview. For further information please visit [email or web address] Please follow instruction sheet, as given.   Referral to EP/ device   Follow-Up: At [facility], you and your health needs are our priority.  As part of our continuing mission to provide you with exceptional heart care, our providers are all part of one team.  This team includes your primary Cardiologist (physician) and  Advanced Practice Providers or APPs (Physician Assistants and Nurse Practitioners) who all work together to provide you with the care you need, when you need it.  Your next appointment:   6 month(s)  Provider:   Newman JINNY [place], MD

## 2024-03-15 NOTE — Progress Notes (Signed)
 Cardiology Office Note:  .   Date:  03/15/2024  ID:  Marilyn Ortiz, DOB 21-Feb-1961, MRN [id number] PCP: Lowella Benton [place], [place]  [facility] Providers Cardiologist:  Newman [place], MD PCP: Lowella Benton [place], [place]  Chief Complaint  Patient presents with   Dyspnea on exertion     Marilyn Ortiz is a 63 y.o. female with hypertension, hyperlipidemia, type 2 diabetes mellitus, s/p pacemaker placement for sick sinus syndrome  Discussed the use of AI scribe software for clinical note transcription with the patient, who gave verbal consent to proceed.  History of Present Illness Marilyn Ortiz is a 63 year old female with a history of pacemaker placement who presents for a cardiology follow-up.  She had a pacemaker placed in 2010 after syncope and had a generator change at [facility] in [place]  about 1-2 years ago. She has had no recurrent syncope. She notes one prior brief episode of feeling lightheaded without loss of consciousness that led to a tilt table test in [place] , which was unrevealing.  She has chronic shortness of breath with exertion that is unchanged. She has no chest pain with activity. Her medications include hydrochlorothiazide , losartan , spironolactone , rosuvastatin  40 mg started 2 months ago for high cholesterol found in October when she was off therapy, and diabetes medications.  She mentions a prior skin cancer near the pacemaker site.  She recently moved back from [place] . She participates in low to moderate intensity activities including senior center programs, drum exercises, Tai Chi, and walking.  I further reviewed patient's chart, last seen by Dr. Fernande in 2016.  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      Vitals:   03/15/24 1337  BP: 137/89  Pulse: 97  SpO2: 93%      Review of Systems  Cardiovascular:  Positive for dyspnea on exertion. Negative for chest pain, leg swelling,  palpitations and syncope.        Studies Reviewed: Marilyn Ortiz        EKG 03/15/2024: Normal sinus rhythm Low voltage QRS When compared with ECG of 18-Aug-2022 12:29, Sinus rhythm has replaced Electronic atrial pacemaker   Labs 01/2024: Chol 208, TG 213, HDL 34, LDL 136 HbA1C 7.0% Hb 13.2 Cr 0.84 TSH 1.7    Physical Exam Vitals and nursing note reviewed.  Constitutional:      General: She is not in acute distress. Neck:     Vascular: No JVD.  Cardiovascular:     Rate and Rhythm: Normal rate and regular rhythm.     Heart sounds: Normal heart sounds. No murmur heard. Pulmonary:     Effort: Pulmonary effort is normal.     Breath sounds: Normal breath sounds. No wheezing or rales.  Musculoskeletal:     Right lower leg: No edema.     Left lower leg: No edema.      VISIT DIAGNOSES:   ICD-10-CM   1. Exertional dyspnea  R06.09 ECHOCARDIOGRAM COMPLETE    MYOCARDIAL PERFUSION IMAGING    Cardiac Stress Test: Informed Consent Details: Physician/Practitioner Attestation; Transcribe to consent form and obtain patient signature    2. Sick sinus syndrome (HCC)  I49.5 EKG 12-Lead    Ambulatory referral to Cardiac Electrophysiology    3. Mixed hyperlipidemia  E78.2 EKG 12-Lead    Lipid panel    4. Primary hypertension  I10     5. Pacemaker-St. Jude's  Z95.0 Ambulatory referral to Cardiac Electrophysiology  6. Type 2 diabetes mellitus with hyperglycemia, without long-term current use of insulin (HCC)  E11.65        Marilyn Ortiz is a 63 y.o. female with hypertension, hyperlipidemia, type 2 diabetes mellitus, s/p pacemaker placement for sick sinus syndrome Assessment & Plan Sick sinus syndrome, status post pacemaker: Pacemaker functioning well, no syncope or malfunction symptoms. - Enrolled in device clinic for pacemaker follow-up. - Scheduled follow-up with electrical doctor for pacemaker monitoring.  Type 2 diabetes mellitus: Diabetes well controlled. Emphasized  monitoring for atypical angina symptoms. - Ordered echocardiogram and exercise nuclear stress test to assess for heart artery abnormalities. - Continue current diabetes medications.  Mixed hyperlipidemia: Cholesterol slightly elevated, triglycerides possibly affected by non-fasting state. - Ordered fasting lipid panel to assess current cholesterol levels.  Hypertension: Diastolic pressure elevated. Discussed potential hydrochlorothiazide  dosage adjustment. - Rechecked blood pressure before leaving. - Consider increasing hydrochlorothiazide  from 12.5 mg to 25 mg if blood pressure remains elevated. - Discuss potential switch to combination pill of losartan  100 mg and hydrochlorothiazide  25 mg with primary care provider.  Lower extremity varicose veins with mild edema: Mild edema likely due to varicose veins, no significant swelling.      Informed Consent   Shared Decision Making/Informed Consent The risks [chest pain, shortness of breath, cardiac arrhythmias, dizziness, blood pressure fluctuations, myocardial infarction, stroke/transient ischemic attack, nausea, vomiting, allergic reaction, radiation exposure, metallic taste sensation and life-threatening complications (estimated to be 1 in 10,000)], benefits (risk stratification, diagnosing coronary artery disease, treatment guidance) and alternatives of a nuclear stress test were discussed in detail with Marilyn Ortiz and she agrees to proceed.        F/u in 6 months  Signed, Newman JINNY [place], MD

## 2024-03-21 ENCOUNTER — Ambulatory Visit (HOSPITAL_BASED_OUTPATIENT_CLINIC_OR_DEPARTMENT_OTHER)
Admission: RE | Admit: 2024-03-21 | Discharge: 2024-03-21 | Disposition: A | Source: Ambulatory Visit | Attending: Urgent Care | Admitting: Urgent Care

## 2024-03-21 DIAGNOSIS — Z780 Asymptomatic menopausal state: Secondary | ICD-10-CM | POA: Diagnosis not present

## 2024-03-21 DIAGNOSIS — Z13820 Encounter for screening for osteoporosis: Secondary | ICD-10-CM | POA: Diagnosis not present

## 2024-03-22 DIAGNOSIS — E782 Mixed hyperlipidemia: Secondary | ICD-10-CM | POA: Diagnosis not present

## 2024-03-22 LAB — LIPID PANEL
Chol/HDL Ratio: 6.9 ratio — ABNORMAL HIGH (ref 0.0–4.4)
Cholesterol, Total: 233 mg/dL — ABNORMAL HIGH (ref 100–199)
HDL: 34 mg/dL — ABNORMAL LOW (ref >39)
LDL Chol Calc (NIH): 167 mg/dL — ABNORMAL HIGH (ref 0–99)
Triglycerides: 172 mg/dL — ABNORMAL HIGH (ref 0–149)
VLDL Cholesterol Cal: 32 mg/dL (ref 5–40)

## 2024-03-23 ENCOUNTER — Ambulatory Visit: Payer: Self-pay | Admitting: Cardiology

## 2024-03-23 DIAGNOSIS — E782 Mixed hyperlipidemia: Secondary | ICD-10-CM

## 2024-03-23 NOTE — Progress Notes (Signed)
 Cholesterol is very high.  If compliant with Crestor  40 mg daily, recommend adding Zetia 10 mg daily.  In spite of that, I do not think her cholesterol would be adequately controlled.  We should go ahead and refer to lipid clinic for consideration for injectable agents.  Thanks MJP

## 2024-04-07 MED ORDER — EZETIMIBE 10 MG PO TABS: 10.0000 mg | ORAL_TABLET | Freq: Every day | ORAL | 3 refills | Status: DC

## 2024-04-07 NOTE — Telephone Encounter (Signed)
 Pt wife returning call. Please advise.

## 2024-04-07 NOTE — Telephone Encounter (Signed)
 The patient has been notified of the result and verbalized understanding.  All questions (if any) were answered. Beyonca Wisz Chauvigne, RN 04/07/2024 10:05 AM   Patient reports being compliant with crestor  40 mg daily. Prescription has been sent in for zetia  and referral has been placed to lipid clinic.

## 2024-04-12 ENCOUNTER — Ambulatory Visit (INDEPENDENT_AMBULATORY_CARE_PROVIDER_SITE_OTHER)

## 2024-04-12 VITALS — Temp 97.8°F | Wt 220.0 lb

## 2024-04-12 DIAGNOSIS — Z23 Encounter for immunization: Secondary | ICD-10-CM | POA: Diagnosis not present

## 2024-04-14 ENCOUNTER — Other Ambulatory Visit: Payer: Self-pay | Admitting: *Deleted

## 2024-04-14 ENCOUNTER — Other Ambulatory Visit (HOSPITAL_COMMUNITY): Payer: Self-pay

## 2024-04-14 ENCOUNTER — Ambulatory Visit: Attending: Cardiology | Admitting: Pharmacist

## 2024-04-14 VITALS — BP 120/90 | HR 89

## 2024-04-14 DIAGNOSIS — E785 Hyperlipidemia, unspecified: Secondary | ICD-10-CM

## 2024-04-14 DIAGNOSIS — E1169 Type 2 diabetes mellitus with other specified complication: Secondary | ICD-10-CM

## 2024-04-14 DIAGNOSIS — I10 Essential (primary) hypertension: Secondary | ICD-10-CM | POA: Diagnosis not present

## 2024-04-14 DIAGNOSIS — E782 Mixed hyperlipidemia: Secondary | ICD-10-CM

## 2024-04-14 LAB — BASIC METABOLIC PANEL WITH GFR
BUN/Creatinine Ratio: 17 (ref 12–28)
BUN: 20 mg/dL (ref 8–27)
CO2: 20 mmol/L (ref 20–29)
Calcium: 10.1 mg/dL (ref 8.7–10.3)
Chloride: 100 mmol/L (ref 96–106)
Creatinine, Ser: 1.17 mg/dL — ABNORMAL HIGH (ref 0.57–1.00)
Glucose: 164 mg/dL — ABNORMAL HIGH (ref 70–99)
Potassium: 3.8 mmol/L (ref 3.5–5.2)
Sodium: 139 mmol/L (ref 134–144)
eGFR: 52 mL/min/1.73 — ABNORMAL LOW

## 2024-04-14 MED ORDER — ROSUVASTATIN CALCIUM 40 MG PO TABS
40.0000 mg | ORAL_TABLET | Freq: Every day | ORAL | 3 refills | Status: AC
  Filled 2024-04-14 – 2024-04-24 (×2): qty 90, 90d supply, fill #0

## 2024-04-14 NOTE — Progress Notes (Signed)
 Patient ID: Marilyn Ortiz                 DOB: 09-Feb-1961                    MRN: [id number]      HPI: Marilyn Ortiz is a 64 y.o. female patient referred to lipid clinic by Dr. Elmira. PMH is significant for hypertension, hyperlipidemia, type 2 diabetes mellitus, s/p pacemaker placement for sick sinus syndrome.   LDL-C in Oct 2025 was 136 (may be falsely low due to TG 213). She was restarted on rosuvastatin  by PCP. Repeat lipid panel in Dec 2025 showed LDL-C 167, TG 172. Patient reported compliance with rosuvastatin . Dr. Elmira added ezetimibe  and referred to lipid clinic.   Patient presents today to clinic.  Luckily she brought in the bag of medications that she is taking.  Rosuvastatin  was not in this bag.  She states that she will check at home to make sure she can get pushed to her husband's side of the cabinet.  She is pretty sure she takes 5 medications per day which are the 5 she brings in.  This would explain why her LDL cholesterol increased from October to December and not decreased.  She reports that she saw her primary care doctor in the beginning of October she was not taking any of her medications and she felt good.  States that she got baseline labs and then her primary care doctor resumed all the medications she had previously been taking.  States that she felt better off of medications than on.  Oddly her potassium dropped after resuming spironolactone  100 mg, losartan  100 mg and hydrochlorothiazide  12.5 mg daily.  Will recheck BMP today.  She reports that sometimes she wakes up in the morning feeling sluggish.  States her head just does not feel right and takes most of the day for her to start feeling better.  She has checked her blood pressure a few times when this has happened.  Reported blood pressure 130/80-90.  States she checked her blood sugar and it was normal.  She does sleep with her CPAP machine.  Will go to bed around 10:00 and wake up at 1 AM feeling wide-awake.  She  would then fall back asleep around 2 or 3 and sleep till 8 AM.  Current Medications: rosuvastatin  40mg  daily, ezetimibe  10mg  daily (not taking either one) Intolerances: None Risk Factors: Diabetes, hypertension, age LDL-C goal: <70 ApoB goal: <80  Diet: not reviewed in detail today  Exercise: not reviewed in detail today  Family History:  Family History  Problem Relation Age of Onset   Allergies Maternal Grandmother    Heart disease Other        paternal side   Rheum arthritis Maternal Grandfather    Ovarian cancer Mother        stage IV, survival   Colon cancer Other        paternal aunt, age late 60s/   Lung cancer Neg Hx    Breast cancer Mother      Social History:  Social History   Socioeconomic History   Marital status: Married    Spouse name: Marilyn Ortiz   Number of children: 3   Years of education: Not on file   Highest education level: Not on file  Occupational History   Occupation: Childcare    Comment: Full-time  Tobacco Use   Smoking status: Never   Smokeless tobacco: Never  Tobacco comments:    Never smoked  Substance and Sexual Activity   Alcohol use: Not Currently    Comment: wine on occ   Drug use: No   Sexual activity: Not on file  Other Topics Concern   Not on file  Social History Narrative   Not on file   Social Drivers of Health   Tobacco Use: Low Risk (04/12/2024)   Patient History    Smoking Tobacco Use: Never    Smokeless Tobacco Use: Never    Passive Exposure: Not on file  Financial Resource Strain: Not on file  Food Insecurity: Not on file  Transportation Needs: Not on file  Physical Activity: Not on file  Stress: Not on file  Social Connections: Not on file  Intimate Partner Violence: Not on file  Depression (PHQ2-9): Low Risk (01/14/2024)   Depression (PHQ2-9)    PHQ-2 Score: 2  Alcohol Screen: Not on file  Housing: Not on file  Utilities: Not on file  Health Literacy: Not on file     Labs: Lipid Panel      Component Value Date/Time   CHOL 233 (H) 03/22/2024 0936   TRIG 172 (H) 03/22/2024 0936   HDL 34 (L) 03/22/2024 0936   CHOLHDL 6.9 (H) 03/22/2024 0936   LDLCALC 167 (H) 03/22/2024 0936   LABVLDL 32 03/22/2024 0936    Past Medical History:  Diagnosis Date   Chronic headaches    Deglutition syncope    Dilation of esophagus    Hemorrhoids    HTN (hypertension)    Hyperlipidemia    Hyperplastic colon polyp    OSA (obstructive sleep apnea)    Skin cancer (melanoma) (HCC) ? 2002   Back    Medications Ordered Prior to Encounter[1]  Allergies[2]  Assessment/Plan:  1. Hyperlipidemia -  Mixed hyperlipidemia Assessment: LDL-C in December with 167 Patient under the impression she was taking rosuvastatin  however when she brought in her medication bottles there was no rosuvastatin  Since we did not see an improvement in her labs, more than likely she has not been on statin therapy Picked up ezetimibe  but has not started  Plan: Start rosuvastatin  40 mg daily-prescription sent to [facility] pharmacy for her to pick up today on her way out Plan to transition all medications to [facility] for delivery-phone number given so she can set this up Repeat labs at next appointment February Hold off on Zetia  for now  Primary hypertension Assessment: Blood pressure 120/90 in clinic today which is above goal of less than 130/80 Patient does not check blood pressure at home consistently Reports that she feels better when she does not take any medications Reports feeling sluggish sometimes in the mornings She is taking all of her medications at night Reports blood pressure when she is feeling poorly is 130/80-90 States she had a blood pressure checked in [place]  and they reported that it was 10 points lower-I suggested she pick up a new blood pressure cuff from the pharmacy today We will recheck BMP today since oddly her potassium decreased after resuming spironolactone  and losartan  along  with her hydrochlorothiazide   Plan: Check BMP today Recommended she move at least the HCTZ or spironolactone  to the morning Continue HCTZ 12.5 mg, spironolactone  100 mg and losartan  100 mg daily Start checking blood pressure regularly at home and bring in both blood pressure cuff and list of blood pressure readings to next visit-reviewed proper technique of checking blood pressure Follow-up in 6 weeks    Thank you,  Carroll Lingelbach D Kennley Schwandt, Pharm.JONETTA SARAN, CPP [facility] [address] [place], [place] [address]  Phone: [phone]; Fax: [phone]           [1]  Current Outpatient Medications on File Prior to Visit  Medication Sig Dispense Refill   celecoxib  (CELEBREX ) 200 MG capsule Take 1 capsule (200 mg total) by mouth 2 (two) times daily with a meal. One to 2 tablets by mouth daily as needed for pain. 60 capsule 2   hydrochlorothiazide  (HYDRODIURIL ) 12.5 MG tablet Take 1 tablet (12.5 mg total) by mouth daily. 90 tablet 1   losartan  (COZAAR ) 100 MG tablet Take 1 tablet (100 mg total) by mouth daily. 90 tablet 1   pantoprazole  (PROTONIX ) 40 MG tablet Take 1 tablet (40 mg total) by mouth daily. 90 tablet 1   spironolactone  (ALDACTONE ) 100 MG tablet Take 1 tablet (100 mg total) by mouth daily. 90 tablet 1   Blood Glucose Monitoring Suppl DEVI For twice daily glucose monitoring. Any device covered by insurance. 1 each 0   Glucose Blood (BLOOD GLUCOSE TEST STRIPS) STRP For twice daily glucose testing. May substitute to any manufacturer covered by patient's insurance. 100 strip 0   Lancet Device MISC For twice daily testing. May substitute to any manufacturer covered by patient's insurance. 1 each 0   Lancets Misc. MISC For twice daily testing. May substitute to any manufacturer covered by patient's insurance. 100 each 0   tirzepatide  (MOUNJARO ) 5 MG/0.5ML Pen Inject 5 mg into the skin once a week. (Patient taking differently:  Inject 2.5 mg into the skin once a week.) 6 mL 2   No current facility-administered medications on file prior to visit.  [2]  Allergies Allergen Reactions   Metformin And Related Other (See Comments)    Severe GI upset   Codeine Nausea And Vomiting and Rash   Morphine And Codeine Nausea And Vomiting and Rash   Penicillins Rash

## 2024-04-14 NOTE — Assessment & Plan Note (Addendum)
 Assessment: Blood pressure 120/90 in clinic today which is above goal of less than 130/80 Patient does not check blood pressure at home consistently Reports that she feels better when she does not take any medications Reports feeling sluggish sometimes in the mornings She is taking all of her medications at night Reports blood pressure when she is feeling poorly is 130/80-90 States she had a blood pressure checked in [place]  and they reported that it was 10 points lower-I suggested she pick up a new blood pressure cuff from the pharmacy today We will recheck BMP today since oddly her potassium decreased after resuming spironolactone  and losartan  along with her hydrochlorothiazide   Plan: Check BMP today Recommended she move at least the HCTZ or spironolactone  to the morning Continue HCTZ 12.5 mg, spironolactone  100 mg and losartan  100 mg daily Start checking blood pressure regularly at home and bring in both blood pressure cuff and list of blood pressure readings to next visit-reviewed proper technique of checking blood pressure Follow-up in 6 weeks

## 2024-04-14 NOTE — Patient Instructions (Addendum)
 [facility] [phone] - Can call and set up delivery of medications  Your blood pressure goal is < 130/65mmHg   Please start checking blood pressure at home. Please record these readings and bring them with you to your next appointment.  Start taking rosuvastatin  40mg  daily. Repeat labs in Feb at appointment with me   Important lifestyle changes to control high blood pressure  Intervention  Effect on the BP   Weight loss Weight loss is one of the most effective lifestyle changes for controlling blood pressure. If you're overweight or obese, losing even a small amount of weight can help reduce blood pressure.    Blood pressure can decrease by 1 millimeter of mercury (mmHg) with each kilogram (about 2.2 pounds) of weight lost.   Exercise regularly As a general goal, aim for 30 minutes of moderate physical activity every day.    Regular physical activity can lower blood pressure by 5 - 8 mmHg.   Eat a healthy diet Eat a diet rich in whole grains, fruits, vegetables, lean meat, and low-fat dairy products. Limit processed foods, saturated fat, and sweets.    A heart-healthy diet can lower high blood pressure by 10 mmHg.   Reduce salt (sodium) in your diet Aim for 000mg  of sodium each day. Avoid deli meats, canned food, and frozen microwave meals which are high in sodium.     Limiting sodium can reduce blood pressure by 5 mmHg.   Limit alcohol One drink equals 12 ounces of beer, 5 ounces of wine, or 1.5 ounces of 80-proof liquor.    Limiting alcohol to < 1 drink a day for women or < 2 drinks a day for men can help lower blood pressure by about 4 mmHg.   To check your pressure at home you will need to:   Sit up in a chair, with feet flat on the floor and back supported. Do not cross your ankles or legs. Rest your left arm so that the cuff is about heart level. If the cuff goes on your upper arm, then just relax your arm on the table, arm of the chair, or your lap.  If you have a wrist cuff, hold your wrist against your chest at heart level. Place the cuff snugly around your arm, about 1 inch above the crease of your elbow. The cords should be inside the groove of your elbow.  Sit quietly, with the cuff in place, for about 5 minutes. Then press the power button to start a reading. Do not talk or move while the reading is taking place.  Record your readings on a sheet of paper. Although most cuffs have a memory, it is often easier to see a pattern developing when the numbers are all in front of you.  You can repeat the reading after 1-3 minutes if it is recommended.   Make sure your bladder is empty and you have not had caffeine or tobacco within the last 30 minutes   Always bring your blood pressure log with you to your appointments. If you have not brought your monitor in to be double checked for accuracy, please bring it to your next appointment.   You can find a list of validated (accurate) blood pressure cuffs at: validatebp.org

## 2024-04-14 NOTE — Assessment & Plan Note (Signed)
 Assessment: LDL-C in December with 167 Patient under the impression she was taking rosuvastatin  however when she brought in her medication bottles there was no rosuvastatin  Since we did not see an improvement in her labs, more than likely she has not been on statin therapy Picked up ezetimibe  but has not started  Plan: Start rosuvastatin  40 mg daily-prescription sent to [facility] pharmacy for her to pick up today on her way out Plan to transition all medications to [facility] for delivery-phone number given so she can set this up Repeat labs at next appointment February Hold off on Zetia  for now

## 2024-04-18 ENCOUNTER — Ambulatory Visit: Payer: Self-pay | Admitting: Pharmacist

## 2024-04-18 DIAGNOSIS — L719 Rosacea, unspecified: Secondary | ICD-10-CM

## 2024-04-18 DIAGNOSIS — I10 Essential (primary) hypertension: Secondary | ICD-10-CM

## 2024-04-18 NOTE — Telephone Encounter (Signed)
 Spoke with patient. Will stop hydrochlorothiazide  and recheck BMP on Friday or Monday.  Continue spironolactone  and losartan 

## 2024-04-22 ENCOUNTER — Other Ambulatory Visit: Payer: Self-pay | Admitting: Urgent Care

## 2024-04-22 DIAGNOSIS — E1165 Type 2 diabetes mellitus with hyperglycemia: Secondary | ICD-10-CM

## 2024-04-24 ENCOUNTER — Telehealth: Payer: Self-pay | Admitting: Urgent Care

## 2024-04-24 ENCOUNTER — Other Ambulatory Visit (HOSPITAL_COMMUNITY): Payer: Self-pay

## 2024-04-24 DIAGNOSIS — Z1211 Encounter for screening for malignant neoplasm of colon: Secondary | ICD-10-CM

## 2024-04-24 NOTE — Telephone Encounter (Unsigned)
 Copied from CRM [id number]. Topic: Referral - Request for Referral >> Apr 24, 2024 11:30 AM Wess RAMAN wrote: Did the patient discuss referral with their provider in the last year? Yes (If No - schedule appointment) (If Yes - send message)  Appointment offered? No  Type of order/referral and detailed reason for visit: colonoscopy  Preference of office, provider, location: [place] locations  If referral order, have you been seen by this specialty before? Yes (If Yes, this issue or another issue? When? Where?  Can we respond through MyChart? Yes

## 2024-04-25 ENCOUNTER — Telehealth (HOSPITAL_COMMUNITY): Payer: Self-pay | Admitting: *Deleted

## 2024-04-25 ENCOUNTER — Other Ambulatory Visit (HOSPITAL_COMMUNITY): Payer: Self-pay

## 2024-04-25 ENCOUNTER — Other Ambulatory Visit: Payer: Self-pay

## 2024-04-25 LAB — BASIC METABOLIC PANEL WITH GFR
BUN/Creatinine Ratio: 14 (ref 12–28)
BUN: 17 mg/dL (ref 8–27)
CO2: 21 mmol/L (ref 20–29)
Calcium: 10.1 mg/dL (ref 8.7–10.3)
Chloride: 107 mmol/L — ABNORMAL HIGH (ref 96–106)
Creatinine, Ser: 1.22 mg/dL — ABNORMAL HIGH (ref 0.57–1.00)
Glucose: 108 mg/dL — ABNORMAL HIGH (ref 70–99)
Potassium: 4 mmol/L (ref 3.5–5.2)
Sodium: 143 mmol/L (ref 134–144)
eGFR: 49 mL/min/1.73 — ABNORMAL LOW

## 2024-04-25 MED ORDER — FELODIPINE ER 2.5 MG PO TB24
2.5000 mg | ORAL_TABLET | Freq: Every day | ORAL | 3 refills | Status: AC
  Filled 2024-04-25: qty 90, 90d supply, fill #0

## 2024-04-25 MED ORDER — ROSUVASTATIN CALCIUM 40 MG PO TABS
40.0000 mg | ORAL_TABLET | Freq: Every day | ORAL | 1 refills | Status: AC
  Filled 2024-04-25: qty 90, 90d supply, fill #0

## 2024-04-25 MED ORDER — EZETIMIBE 10 MG PO TABS: 10.0000 mg | ORAL_TABLET | Freq: Every day | ORAL | 3 refills | Status: AC

## 2024-04-25 NOTE — Telephone Encounter (Signed)
 Left detailed instructions for stress test.

## 2024-04-25 NOTE — Addendum Note (Signed)
 Addended by: Earlie Arciga D on: 04/25/2024 04:46 PM   Modules accepted: Orders

## 2024-04-25 NOTE — Telephone Encounter (Signed)
 Patient does not find spironolactone  very effective for her hemorrhoids.  Will decrease to 50 mg to see if this helps improve kidney function.  Start felodipine  2.5 mg daily.  Rx sent to [facility].  Will recheck BMP on 1/30.  At that point if blood pressure is not much improved we can increase felodipine .

## 2024-04-26 ENCOUNTER — Encounter: Payer: Self-pay | Admitting: Pharmacist

## 2024-04-26 ENCOUNTER — Other Ambulatory Visit (HOSPITAL_COMMUNITY): Payer: Self-pay

## 2024-04-26 ENCOUNTER — Other Ambulatory Visit: Payer: Self-pay

## 2024-04-27 ENCOUNTER — Other Ambulatory Visit (HOSPITAL_COMMUNITY): Payer: Self-pay

## 2024-05-01 ENCOUNTER — Other Ambulatory Visit: Payer: Self-pay

## 2024-05-02 ENCOUNTER — Other Ambulatory Visit (HOSPITAL_COMMUNITY): Payer: Self-pay

## 2024-05-02 ENCOUNTER — Other Ambulatory Visit: Payer: Self-pay

## 2024-05-02 ENCOUNTER — Other Ambulatory Visit: Payer: Self-pay | Admitting: Cardiology

## 2024-05-02 DIAGNOSIS — R0609 Other forms of dyspnea: Secondary | ICD-10-CM

## 2024-05-03 ENCOUNTER — Other Ambulatory Visit: Payer: Self-pay

## 2024-05-04 ENCOUNTER — Ambulatory Visit (HOSPITAL_BASED_OUTPATIENT_CLINIC_OR_DEPARTMENT_OTHER)
Admission: RE | Admit: 2024-05-04 | Discharge: 2024-05-04 | Disposition: A | Discharge status: Home / Self Care | Source: Ambulatory Visit | Attending: Cardiology | Admitting: Cardiology

## 2024-05-04 ENCOUNTER — Ambulatory Visit (HOSPITAL_COMMUNITY)
Admission: RE | Admit: 2024-05-04 | Discharge: 2024-05-04 | Disposition: A | Discharge status: Home / Self Care | Source: Ambulatory Visit | Attending: Cardiology | Admitting: Cardiology

## 2024-05-04 DIAGNOSIS — R0609 Other forms of dyspnea: Secondary | ICD-10-CM | POA: Diagnosis not present

## 2024-05-04 LAB — MYOCARDIAL PERFUSION IMAGING
Angina Index: 0
Duke Treadmill Score: 4
Estimated workload: 5.8
Exercise duration (min): 4 min
Exercise duration (sec): 0 s
LV dias vol: 62 mL (ref 46–106)
LV sys vol: 10 mL
MPHR: 155 {beats}/min
Nuc Stress EF: 84 %
Peak HR: 153 {beats}/min
Percent HR: 98 %
Rest HR: 97 {beats}/min
Rest Nuclear Isotope Dose: 10.4 mCi
SDS: 0
SRS: 0
SSS: 0
ST Depression (mm): 0 mm
Stress Nuclear Isotope Dose: 31.6 mCi
TID: 1.06

## 2024-05-04 LAB — ECHOCARDIOGRAM COMPLETE
Area-P 1/2: 3.48 cm2
S' Lateral: 2.69 cm

## 2024-05-04 MED ORDER — TECHNETIUM TC 99M TETROFOSMIN IV KIT
31.6000 | PACK | Freq: Once | INTRAVENOUS | Status: AC | PRN
  Administered 2024-05-04: 31.6 via INTRAVENOUS

## 2024-05-04 MED ORDER — PERFLUTREN LIPID MICROSPHERE
1.0000 mL | INTRAVENOUS | Status: AC | PRN
  Administered 2024-05-04: 2 mL via INTRAVENOUS

## 2024-05-04 MED ORDER — TECHNETIUM TC 99M TETROFOSMIN IV KIT
10.4000 | PACK | Freq: Once | INTRAVENOUS | Status: AC | PRN
  Administered 2024-05-04: 10.4 via INTRAVENOUS

## 2024-05-07 ENCOUNTER — Ambulatory Visit: Payer: Self-pay | Admitting: Cardiology

## 2024-05-12 ENCOUNTER — Encounter: Payer: Self-pay | Admitting: Urgent Care

## 2024-05-12 ENCOUNTER — Ambulatory Visit: Admitting: Urgent Care

## 2024-05-12 VITALS — BP 110/77 | HR 84 | Ht 64.0 in | Wt 222.0 lb

## 2024-05-12 DIAGNOSIS — E785 Hyperlipidemia, unspecified: Secondary | ICD-10-CM

## 2024-05-12 DIAGNOSIS — G8929 Other chronic pain: Secondary | ICD-10-CM

## 2024-05-12 DIAGNOSIS — I495 Sick sinus syndrome: Secondary | ICD-10-CM

## 2024-05-12 DIAGNOSIS — R7989 Other specified abnormal findings of blood chemistry: Secondary | ICD-10-CM

## 2024-05-12 DIAGNOSIS — Z79899 Other long term (current) drug therapy: Secondary | ICD-10-CM | POA: Diagnosis not present

## 2024-05-12 DIAGNOSIS — K449 Diaphragmatic hernia without obstruction or gangrene: Secondary | ICD-10-CM

## 2024-05-12 DIAGNOSIS — E1165 Type 2 diabetes mellitus with hyperglycemia: Secondary | ICD-10-CM | POA: Diagnosis not present

## 2024-05-12 DIAGNOSIS — E1169 Type 2 diabetes mellitus with other specified complication: Secondary | ICD-10-CM

## 2024-05-12 DIAGNOSIS — I10 Essential (primary) hypertension: Secondary | ICD-10-CM | POA: Diagnosis not present

## 2024-05-12 DIAGNOSIS — R911 Solitary pulmonary nodule: Secondary | ICD-10-CM

## 2024-05-12 NOTE — Patient Instructions (Signed)
 Continue all meds as ordered for now. To help with bowel, please take 17g miralax daily with plenty of water . Prune juice and bran can also be helpful.   Follow up with me in 4 months

## 2024-05-12 NOTE — Progress Notes (Unsigned)
 "  Established Patient Office Visit  Subjective:  Patient ID: Marilyn Ortiz, female    DOB: 1960-10-08  Age: 64 y.o. 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 2002   Back   Past Surgical History:  Procedure Laterality Date   ABDOMINAL HYSTERECTOMY     BLADDER SUSPENSION     CESAREAN SECTION     x 2   COLONOSCOPY     normal per patient. Around 2010 in [place].   COLONOSCOPY N/A 12/27/2013   Dr.Rourk- anal canal hemorrhoids- single rectosigmoid polyp. 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 bx= hyperplastic polyp.   ESOPHAGEAL DILATION     ESOPHAGOGASTRODUODENOSCOPY  03/20/2014   RMR: Schazki's ring status-post dilation as described above. Hiatal hernia.    KNEE SURGERY     right   LUMBAR DISC SURGERY     L4   PACEMAKER INSERTION     St. Jude Accent 2210   TUBAL LIGATION     VESICOVAGINAL FISTULA CLOSURE W/ TAH     Social History[1]    ROS: as noted in HPI  Objective:     BP 110/77   Pulse 84   Ht 5' 4 (1.626 m)   Wt 222 lb (100.7 kg)   SpO2 97%   BMI 38.11 kg/m  BP Readings  from Last 3 Encounters:  05/12/24 110/77  04/14/24 (!) 120/90  03/15/24 116/76   Wt Readings from Last 3 Encounters:  05/12/24 222 lb (100.7 kg)  04/12/24 220 lb (99.8 kg)  03/15/24 227 lb 6.4 oz (103.1 kg)      Physical Exam   No results found for any visits on 05/12/24.  Last CBC Lab Results  Component Value Date   WBC 5.7 01/14/2024   HGB 13.2 01/14/2024   HCT 41.9 01/14/2024   MCV 91 01/14/2024   MCH 28.8 01/14/2024   RDW 13.7 01/14/2024   PLT 211 01/14/2024   Last metabolic panel Lab Results  Component Value Date   GLUCOSE 108 (H) 04/24/2024   NA 143 04/24/2024   K 4.0 04/24/2024   CL 107 (H) 04/24/2024   CO2 21 04/24/2024   BUN 17 04/24/2024   CREATININE 1.22 (H) 04/24/2024   EGFR 49 (L) 04/24/2024   CALCIUM  10.1 04/24/2024   PROT 6.8 02/11/2024   ALBUMIN 4.3 02/11/2024   LABGLOB 2.5 02/11/2024   BILITOT 0.6 02/11/2024   ALKPHOS 82 02/11/2024   AST 17 02/11/2024   ALT 13 02/11/2024   Last lipids Lab Results  Component Value Date  CHOL 233 (H) 03/22/2024   HDL 34 (L) 03/22/2024   LDLCALC 167 (H) 03/22/2024   TRIG 172 (H) 03/22/2024   CHOLHDL 6.9 (H) 03/22/2024   Last hemoglobin A1c Lab Results  Component Value Date   HGBA1C 7.0 (H) 01/14/2024   Last thyroid  functions Lab Results  Component Value Date   TSH 1.770 01/14/2024   Last vitamin D No results found for: 25OHVITD2, 25OHVITD3, VD25OH Last vitamin B12 and Folate No results found for: VITAMINB12, FOLATE    The 10-year ASCVD risk score (Arnett DK, et al., 2019) is: 13.7%  Assessment & Plan:  Primary hypertension -     Hemoglobin A1c -     CMP14+EGFR -     CBC with Differential/Platelet  Type 2 diabetes mellitus with hyperglycemia, without long-term current use of insulin (HCC) -     Hemoglobin A1c -     CMP14+EGFR -     CBC with Differential/Platelet  Hyperlipidemia associated with type 2 diabetes mellitus (HCC) -     Lipid panel  Chronic pain of both  knees  Hiatal hernia  Long-term use of high-risk medication -     Lipid panel -     Hemoglobin A1c -     CMP14+EGFR -     CBC with Differential/Platelet -     B12 and Folate Panel  Elevated serum creatinine     No follow-ups on file.   Benton LITTIE Gave, PA    [1]  Social History Tobacco Use   Smoking status: Never   Smokeless tobacco: Never   Tobacco comments:    Never smoked  Substance Use Topics   Alcohol use: Not Currently    Comment: wine on occ   Drug use: No   "

## 2024-05-13 ENCOUNTER — Ambulatory Visit: Payer: Self-pay | Admitting: Urgent Care

## 2024-05-13 LAB — LIPID PANEL
Chol/HDL Ratio: 4.4 ratio (ref 0.0–4.4)
Cholesterol, Total: 151 mg/dL (ref 100–199)
HDL: 34 mg/dL — ABNORMAL LOW
LDL Chol Calc (NIH): 87 mg/dL (ref 0–99)
Triglycerides: 173 mg/dL — ABNORMAL HIGH (ref 0–149)
VLDL Cholesterol Cal: 30 mg/dL (ref 5–40)

## 2024-05-13 LAB — CMP14+EGFR
ALT: 13 [IU]/L (ref 0–32)
AST: 18 [IU]/L (ref 0–40)
Albumin: 4.2 g/dL (ref 3.9–4.9)
Alkaline Phosphatase: 85 [IU]/L (ref 49–135)
BUN/Creatinine Ratio: 17 (ref 12–28)
BUN: 17 mg/dL (ref 8–27)
Bilirubin Total: 1 mg/dL (ref 0.0–1.2)
CO2: 23 mmol/L (ref 20–29)
Calcium: 9.8 mg/dL (ref 8.7–10.3)
Chloride: 106 mmol/L (ref 96–106)
Creatinine, Ser: 1.01 mg/dL — ABNORMAL HIGH (ref 0.57–1.00)
Globulin, Total: 2.6 g/dL (ref 1.5–4.5)
Glucose: 113 mg/dL — ABNORMAL HIGH (ref 70–99)
Potassium: 3.6 mmol/L (ref 3.5–5.2)
Sodium: 143 mmol/L (ref 134–144)
Total Protein: 6.8 g/dL (ref 6.0–8.5)
eGFR: 62 mL/min/{1.73_m2}

## 2024-05-13 LAB — CBC WITH DIFFERENTIAL/PLATELET
Basophils Absolute: 0.1 10*3/uL (ref 0.0–0.2)
Basos: 1 %
EOS (ABSOLUTE): 0.3 10*3/uL (ref 0.0–0.4)
Eos: 3 %
Hematocrit: 41.3 % (ref 34.0–46.6)
Hemoglobin: 13.3 g/dL (ref 11.1–15.9)
Immature Grans (Abs): 0 10*3/uL (ref 0.0–0.1)
Immature Granulocytes: 0 %
Lymphocytes Absolute: 1.7 10*3/uL (ref 0.7–3.1)
Lymphs: 22 %
MCH: 27.8 pg (ref 26.6–33.0)
MCHC: 32.2 g/dL (ref 31.5–35.7)
MCV: 86 fL (ref 79–97)
Monocytes Absolute: 0.6 10*3/uL (ref 0.1–0.9)
Monocytes: 8 %
Neutrophils Absolute: 5 10*3/uL (ref 1.4–7.0)
Neutrophils: 66 %
Platelets: 247 10*3/uL (ref 150–450)
RBC: 4.78 x10E6/uL (ref 3.77–5.28)
RDW: 14.6 % (ref 11.7–15.4)
WBC: 7.7 10*3/uL (ref 3.4–10.8)

## 2024-05-13 LAB — MICROALBUMIN / CREATININE URINE RATIO
Creatinine, Urine: 211.2 mg/dL
Microalb/Creat Ratio: 11 mg/g{creat} (ref 0–29)
Microalbumin, Urine: 23.4 ug/mL

## 2024-05-13 LAB — HEMOGLOBIN A1C
Est. average glucose Bld gHb Est-mCnc: 146 mg/dL
Hgb A1c MFr Bld: 6.7 % — ABNORMAL HIGH (ref 4.8–5.6)

## 2024-05-13 LAB — B12 AND FOLATE PANEL
Folate: 5.2 ng/mL
Vitamin B-12: 252 pg/mL (ref 232–1245)

## 2024-05-15 ENCOUNTER — Other Ambulatory Visit (HOSPITAL_BASED_OUTPATIENT_CLINIC_OR_DEPARTMENT_OTHER): Payer: Self-pay

## 2024-05-15 ENCOUNTER — Other Ambulatory Visit: Payer: Self-pay

## 2024-05-15 ENCOUNTER — Other Ambulatory Visit (HOSPITAL_COMMUNITY): Payer: Self-pay

## 2024-05-15 ENCOUNTER — Encounter: Payer: Self-pay | Admitting: Pharmacist

## 2024-05-15 MED ORDER — SPIRONOLACTONE 50 MG PO TABS
50.0000 mg | ORAL_TABLET | Freq: Every day | ORAL | 3 refills | Status: AC
  Filled 2024-05-15: qty 90, 90d supply, fill #0

## 2024-05-31 ENCOUNTER — Ambulatory Visit: Admitting: Pharmacist

## 2024-09-08 ENCOUNTER — Ambulatory Visit: Admitting: Urgent Care
